# Patient Record
Sex: Female | Born: 1955
Health system: Southern US, Community
[De-identification: ages and names within clinical notes are randomized; demographics above are authoritative.]

## PROBLEM LIST (undated history)

## (undated) DIAGNOSIS — R7301 Impaired fasting glucose: Secondary | ICD-10-CM

## (undated) DIAGNOSIS — E785 Hyperlipidemia, unspecified: Secondary | ICD-10-CM

## (undated) DIAGNOSIS — E039 Hypothyroidism, unspecified: Secondary | ICD-10-CM

## (undated) DIAGNOSIS — I878 Other specified disorders of veins: Secondary | ICD-10-CM

## (undated) DIAGNOSIS — J45909 Unspecified asthma, uncomplicated: Secondary | ICD-10-CM

## (undated) DIAGNOSIS — J309 Allergic rhinitis, unspecified: Secondary | ICD-10-CM

## (undated) HISTORY — DX: Hypothyroidism, unspecified: E03.9

## (undated) HISTORY — PX: CHOLECYSTECTOMY: SHX55

## (undated) HISTORY — DX: Allergic rhinitis, unspecified: J30.9

## (undated) HISTORY — DX: Other specified disorders of veins: I87.8

## (undated) HISTORY — DX: Unspecified asthma, uncomplicated: J45.909

## (undated) HISTORY — DX: Impaired fasting glucose: R73.01

## (undated) HISTORY — DX: Hyperlipidemia, unspecified: E78.5

---

## 2009-09-19 ENCOUNTER — Encounter: Payer: Self-pay | Admitting: Orthopedic Surgery

## 2009-09-21 ENCOUNTER — Ambulatory Visit: Payer: Self-pay | Admitting: Orthopedic Surgery

## 2009-09-21 DIAGNOSIS — M25519 Pain in unspecified shoulder: Secondary | ICD-10-CM | POA: Insufficient documentation

## 2009-10-05 ENCOUNTER — Ambulatory Visit: Payer: Self-pay | Admitting: Orthopedic Surgery

## 2010-03-28 NOTE — Assessment & Plan Note (Signed)
Summary: 2 WK RE-CK RT UPPER PAIN/[employer] [facility]   Visit Type:  Follow-up Referring Ragena Fiola:  self Primary Damere Brandenburg:  Dr. Simone Curia  CC:  recheck arm.  History of Present Illness: I saw Ellen Ayers in the office today for an initial visit.  She is a 55 years old woman with the complaint of:  right arm pain.  The patient describes various symptoms in the RIGHT upper extremity over the last 2 months.  Things seemed to go wrong when she noticed pain in her RIGHT thumb at the base of the thumb at the basal joint.  She had pain with squeezing and pinching.  This pain worsened and radiated up towards her elbow but not quite to it.  She then started having some difficulties over the RIGHT deltoid and RIGHT upper arm over the biceps.  This was exacerbated when she tried to pull up her husband's TED hose.  This progressed to include trouble reaching behind her and pain in the deltoid and the biceps area when she actually lies on her LEFT side.  She does not really have pain with forward elevation but she does have some pain with weighted abduction forward elevation such as when something is in her hand.  Meds: Claritin, Ibuprofen.  Today is 2 week recheck on right arm, shoulder.  Somewhat better.  Allergies: No Known Drug Allergies  Physical Exam  Additional Exam:  repeat examination shows that she continues to have some mild discomfort in the RIGHT upper extremity but her range of motion is full her strength is excellent her shoulder is stable her neurologic exam is normal her skin is intact she has no lymphadenopathy Pulses, the biceps test including a speed's test and Yergason test are normal crank test was normal.   Impression & Recommendations:  Problem # 1:  SHOULDER PAIN (ICD-719.41) Assessment Improved  she says that her pain is going to go up to $70 so she think she is a better that she can not schedule a followup I gave her some shoulder exercises to do and if she  improves she does not have to come back  Orders: Est. Patient Level III (16109)  Patient Instructions: 1)  Biceps tests was fine 2)  You have some shoulder inflammation 3)  Please continue Ibuprofen and Exercises for 6 weeks at least three timer per week 4)  if improved will not come back

## 2010-03-28 NOTE — Letter (Signed)
Summary: History form  History form   Imported By: Jacklynn Ganong 09/22/2009 11:26:42  _____________________________________________________________________  External Attachment:    Type:   Image     Comment:   External Document

## 2010-03-28 NOTE — Assessment & Plan Note (Signed)
Summary: RT UPPER ARM PAIN/NEEDS XRAY/[employer] [facility]   Vital Signs:  Patient profile:   55 year old female Height:      62 inches Weight:      280 pounds Pulse rate:   78 / minute Resp:     18 per minute  Vitals Entered By: Fuller Canada MD (September 21, 2009 11:33 AM)  Visit Type:  new patient Referring Provider:  self Primary Provider:  Dr. Simone Curia  CC:  right arm pain.  History of Present Illness: I saw Ellen Ayers in the office today for an initial visit.  She is a 55 years old woman with the complaint of:  right arm pain.  The patient describes various symptoms in the RIGHT upper extremity over the last 2 months.  Things seemed to go wrong when she noticed pain in her RIGHT thumb at the base of the thumb at the basal joint.  She had pain with squeezing and pinching.  This pain worsened and radiated up towards her elbow but not quite to it.  She then started having some difficulties over the RIGHT deltoid and RIGHT upper arm over the biceps.  This was exacerbated when she tried to pull up her husband's TED hose.  This progressed to include trouble reaching behind her and pain in the deltoid and the biceps area when she actually lies on her LEFT side.  She does not really have pain with forward elevation but she does have some pain with weighted abduction forward elevation such as when something is in her hand.  Meds: Claritin.    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  Review of Systems Respiratory:  Complains of snoring; denies short of breath, wheezing, couch, tightness, pain on inspiration, and snoring  . Musculoskeletal:  Complains of muscle pain; denies joint pain, swelling, instability, stiffness, redness, and heat. Immunology:  Complains of seasonal allergies; denies sinus problems and allergic to bee stings.  The review of systems is negative for Constitutional, Cardiovascular, Gastrointestinal, Genitourinary, Neurologic, Endocrine, Psychiatric, Skin, HEENT, and Hemoatologic.  Physical Exam  Skin:  intact without lesions or rashes Cervical Nodes:  no significant adenopathy Psych:  alert and cooperative; normal mood and affect; normal attention span and concentration   Shoulder/Elbow Exam  General:    Well-developed, well-nourished, endomorphic body habitus; no deformities, normal grooming.    Inspection:    Inspection is normal.    Palpation:    the a.c. joint was not tender there was some mild tenderness at the anterolateral corner of the acromion and in the soft tissue, the posterior inferior subacromial soft tissues were nontender the biceps was nontender the coracoid was nontender  Vascular:    Radial, ulnar, brachial, and axillary pulses 2+ and symmetric; capillary refill less than 2 seconds; no evidence of ischemia, clubbing, or cyanosis.    Sensory:    Gross sensation intact in the upper extremities.    Motor:    Normal strength in the upper extremities.    Reflexes:    Normal reflexes in the upper extremities.    Shoulder Exam:    Right:    Inspection/Palpation:  passive range of motion.  Cuff strength grade  5, shoulder stable.    Left:    Inspection:  Normal    Palpation:  Normal  Impingement Sign NEER:    Right negative; Left negative Impingement Sign HAWKINS:    Right negative; Left negative Apprehension Sign:    Right negative; Left negative AC joint Adduction Test:    Right negative; Left negative   Impression & Recommendations:  Problem # 1:  SHOULDER PAIN (ICD-719.41) Assessment New  it is unclear right now what this is a differential  diagnosis is biceps tendinopathy, rotator cuff syndrome, rotator cuff tear, a.c. joint arthritis, trigger point, cervical disc disease.  Recommend ibuprofen t.i.d. with topical sports cream or rub and a 2 week recheck  Orders: New Patient Level III (16109)  Patient Instructions: 1)  Please schedule a follow-up appointment in 2 weeks.

## 2012-06-03 ENCOUNTER — Telehealth: Payer: Self-pay | Admitting: Family Medicine

## 2012-06-03 DIAGNOSIS — E785 Hyperlipidemia, unspecified: Secondary | ICD-10-CM

## 2012-06-03 DIAGNOSIS — Z79899 Other long term (current) drug therapy: Secondary | ICD-10-CM

## 2012-06-03 NOTE — Telephone Encounter (Signed)
Patient needs BW paper °

## 2012-06-03 NOTE — Telephone Encounter (Signed)
Blood work papers printed and left up front for patient pick up per doctors orders. Patient aware.

## 2012-06-03 NOTE — Telephone Encounter (Signed)
Lip liv glu 

## 2012-06-05 ENCOUNTER — Encounter: Payer: Self-pay | Admitting: *Deleted

## 2012-06-07 LAB — HEPATIC FUNCTION PANEL
Albumin: 3.7 g/dL (ref 3.5–5.2)
Total Protein: 6.6 g/dL (ref 6.0–8.3)

## 2012-06-07 LAB — LIPID PANEL
HDL: 45 mg/dL (ref >39)
LDL Cholesterol: 115 mg/dL — ABNORMAL HIGH (ref 0–99)
Total CHOL/HDL Ratio: 4.1 Ratio
Triglycerides: 129 mg/dL (ref <150)
VLDL: 26 mg/dL (ref 0–40)

## 2012-06-09 ENCOUNTER — Ambulatory Visit (INDEPENDENT_AMBULATORY_CARE_PROVIDER_SITE_OTHER): Payer: BC Managed Care – PPO | Admitting: Family Medicine

## 2012-06-09 ENCOUNTER — Encounter: Payer: Self-pay | Admitting: Family Medicine

## 2012-06-09 VITALS — BP 144/92 | HR 80 | Ht 61.0 in | Wt 288.0 lb

## 2012-06-09 DIAGNOSIS — E785 Hyperlipidemia, unspecified: Secondary | ICD-10-CM | POA: Insufficient documentation

## 2012-06-09 DIAGNOSIS — E039 Hypothyroidism, unspecified: Secondary | ICD-10-CM | POA: Insufficient documentation

## 2012-06-09 DIAGNOSIS — B353 Tinea pedis: Secondary | ICD-10-CM | POA: Insufficient documentation

## 2012-06-09 MED ORDER — PRAVASTATIN SODIUM 20 MG PO TABS: 20.0000 mg | ORAL_TABLET | Freq: Every day | ORAL | Status: DC

## 2012-06-09 MED ORDER — TERBINAFINE HCL 250 MG PO TABS: 250.0000 mg | ORAL_TABLET | Freq: Every day | ORAL | Status: DC

## 2012-06-09 MED ORDER — LEVOTHYROXINE SODIUM 88 MCG PO TABS: 88.0000 ug | ORAL_TABLET | Freq: Every day | ORAL | Status: DC

## 2012-06-09 NOTE — Progress Notes (Signed)
  Subjective:    Patient ID: Ellen Ayers, female    DOB: [birth year], 57 y.o.   MRN: [id number]  HPI Subjective:    Ellen Ayers is here for follow up of dyslipidemia.  Compliance with treatment has been good. The patient exercises never. Patient denies muscle pain associated with her medications.  The following portions of the patient's history were reviewed and updated as appropriate: allergies, current medications, past family history, past medical history, past social history, past surgical history and problem list.  Review of Systems Pertinent items are noted in HPI.    Objective:    General appearance: alert Lungs clear heart rrr feet tinea pedis changes bp good Lab Review Lab Results  Component Value Date   CHOL 186 06/03/2012   TRIG 129 06/03/2012   HDL 45 06/03/2012      Assessment:    Dyslipidemia under good control.    Plan:    1. Continue dietary measures. 2. Continue regular exercise. 3. Lipid-lowering medications: Pravastatin 10mg  QHS.    Review of Systems     Objective:   Physical Exam        Assessment & Plan:  Impression #1 hypothyroidism clinically stable. #2 tenia pedis discussed. #3 hyperlipidemia discussed. The numbers could at last visit. #4 perennial rhinitis-ongoing. Plan 25 minutes spent with patient most in discussion. Lamisil 250 daily for 30 days. Maintain loratadine. Maintain other meds.upper appropriate blood work. WSL

## 2012-06-10 ENCOUNTER — Ambulatory Visit: Payer: Self-pay | Admitting: Family Medicine

## 2012-10-09 ENCOUNTER — Encounter: Payer: Self-pay | Admitting: Nurse Practitioner

## 2012-10-09 ENCOUNTER — Ambulatory Visit (INDEPENDENT_AMBULATORY_CARE_PROVIDER_SITE_OTHER): Payer: BC Managed Care – PPO | Admitting: Nurse Practitioner

## 2012-10-09 VITALS — BP 110/86 | Temp 98.8°F | Ht 62.0 in | Wt 290.6 lb

## 2012-10-09 DIAGNOSIS — J069 Acute upper respiratory infection, unspecified: Secondary | ICD-10-CM

## 2012-10-09 MED ORDER — ALBUTEROL SULFATE HFA 108 (90 BASE) MCG/ACT IN AERS: 2.0000 | INHALATION_SPRAY | RESPIRATORY_TRACT | Status: DC | PRN

## 2012-10-09 MED ORDER — AMOXICILLIN-POT CLAVULANATE 875-125 MG PO TABS: 1.0000 | ORAL_TABLET | Freq: Two times a day (BID) | ORAL | Status: DC

## 2012-10-09 MED ORDER — TRIAMTERENE-HCTZ 37.5-25 MG PO CAPS: 1.0000 | ORAL_CAPSULE | ORAL | Status: DC

## 2012-10-10 ENCOUNTER — Encounter: Payer: Self-pay | Admitting: Nurse Practitioner

## 2012-10-10 NOTE — Progress Notes (Signed)
Subjective:  Presents with complaints of a bad cough over the past 4-5 days. No fever. Facial area headache. No wheezing. Some hoarseness but no sore throat. Some ear pain. No vomiting diarrhea or abdominal pain. Slight shortness of breath with singing talking or activity. Still has some of her Ventolin inhaler, this helps her shortness of breath. At end of visit patient requesting a refill on her Dyazide which she uses sparingly for edema.  Objective:   BP 110/86  Temp(Src) 98.8 F (37.1 C) (Oral)  Ht 5\' 2"  (1.575 m)  Wt 290 lb 9.6 oz (131.815 kg)  BMI 53.14 kg/m2 NAD. Alert, oriented. TMs significant clear effusion, no erythema. Pharynx mildly injected with PND noted. Neck supple with mild soft nontender adenopathy. Occasional harsh bronchitic cough noted. Lungs clear. Heart regular rate rhythm.  Assessment:Acute upper respiratory infections of unspecified site  Plan: Meds ordered this encounter  Medications  . amoxicillin-clavulanate (AUGMENTIN) 875-125 MG per tablet    Sig: Take 1 tablet by mouth 2 (two) times daily.    Dispense:  20 tablet    Refill:  0    Order Specific Question:  Supervising Provider    Answer:  Merlyn Albert [[id number]]  . triamterene-hydrochlorothiazide (DYAZIDE) 37.5-25 MG per capsule    Sig: Take 1 each (1 capsule total) by mouth every morning.    Dispense:  30 capsule    Refill:  5    Order Specific Question:  Supervising Provider    Answer:  Merlyn Albert [[id number]]  . albuterol (PROVENTIL HFA;VENTOLIN HFA) 108 (90 BASE) MCG/ACT inhaler    Sig: Inhale 2 puffs into the lungs every 4 (four) hours as needed for wheezing.    Dispense:  1 Inhaler    Refill:  2    HOLD UNTIL PATIENT NEEDS THIS    Order Specific Question:  Supervising Provider    Answer:  Merlyn Albert [[id number]]   OTC meds as directed for congestion. Call back in 4-5 days if no improvement, sooner if worse. Otherwise routine followup.

## 2012-10-16 ENCOUNTER — Telehealth: Payer: Self-pay | Admitting: Family Medicine

## 2012-10-16 MED ORDER — CEFPROZIL 500 MG PO TABS: 500.0000 mg | ORAL_TABLET | Freq: Two times a day (BID) | ORAL | Status: DC

## 2012-10-16 NOTE — Telephone Encounter (Signed)
Med sent electronically to [facility].

## 2012-10-16 NOTE — Telephone Encounter (Signed)
Patient says that she does not think that the antibiotic she was prescribed by Eber Jones the other day is working  She is still having persistent coughing, SOB after exertion-no congestion, fever or headache [facility]

## 2012-10-16 NOTE — Telephone Encounter (Signed)
Cefzil 500 bid for 10 days

## 2012-10-16 NOTE — Telephone Encounter (Signed)
Please call her at [phone] to reach her

## 2013-01-16 ENCOUNTER — Telehealth: Payer: Self-pay | Admitting: Family Medicine

## 2013-01-16 DIAGNOSIS — E039 Hypothyroidism, unspecified: Secondary | ICD-10-CM

## 2013-01-16 DIAGNOSIS — E785 Hyperlipidemia, unspecified: Secondary | ICD-10-CM

## 2013-01-16 DIAGNOSIS — Z79899 Other long term (current) drug therapy: Secondary | ICD-10-CM

## 2013-01-16 MED ORDER — LEVOTHYROXINE SODIUM 88 MCG PO TABS: 88.0000 ug | ORAL_TABLET | Freq: Every day | ORAL | Status: DC

## 2013-01-16 NOTE — Telephone Encounter (Signed)
Patient needs a prescription for levothyroxine (SYNTHROID, LEVOTHROID) 88 MCG tablet sent to [facility] in [place].  Patient will out of this medication on Saturday 01/17/2013.  [facility] informed patient they sent our office communication regarding this medication on 01/14/2013.

## 2013-01-16 NOTE — Telephone Encounter (Signed)
Blood work ordered in Epic. Patient was notified.  

## 2013-01-16 NOTE — Telephone Encounter (Signed)
Refill on medication sent to [facility] in Epic. Patient is due for office visit. Patient wants blood work ordered first and then she will schedule office visit after this.

## 2013-01-16 NOTE — Telephone Encounter (Signed)
Lip liv m7 tsh 

## 2013-01-16 NOTE — Addendum Note (Signed)
Addended by: Dereck Ligas on: 01/16/2013 03:45 PM   Modules accepted: Orders

## 2013-02-06 ENCOUNTER — Telehealth: Payer: Self-pay | Admitting: Family Medicine

## 2013-02-06 MED ORDER — ALBUTEROL SULFATE HFA 108 (90 BASE) MCG/ACT IN AERS: 2.0000 | INHALATION_SPRAY | RESPIRATORY_TRACT | Status: DC | PRN

## 2013-02-06 NOTE — Telephone Encounter (Signed)
Patient notified

## 2013-02-06 NOTE — Telephone Encounter (Signed)
Pt lost her Albuterol inhaler, can we call in another one?  [facility] Please call pt when don

## 2013-02-13 ENCOUNTER — Other Ambulatory Visit: Payer: Self-pay | Admitting: Family Medicine

## 2013-02-18 LAB — BASIC METABOLIC PANEL
BUN: 11 mg/dL (ref 6–23)
Creat: 0.79 mg/dL (ref 0.50–1.10)

## 2013-02-18 LAB — HEPATIC FUNCTION PANEL
ALT: 17 U/L (ref 0–35)
Alkaline Phosphatase: 85 U/L (ref 39–117)
Indirect Bilirubin: 0.9 mg/dL (ref 0.0–0.9)
Total Protein: 6.4 g/dL (ref 6.0–8.3)

## 2013-02-18 LAB — LIPID PANEL
Cholesterol: 193 mg/dL (ref 0–200)
Triglycerides: 186 mg/dL — ABNORMAL HIGH (ref <150)

## 2013-02-18 LAB — TSH: TSH: 3.583 u[IU]/mL (ref 0.350–4.500)

## 2013-03-03 ENCOUNTER — Ambulatory Visit (INDEPENDENT_AMBULATORY_CARE_PROVIDER_SITE_OTHER): Payer: BC Managed Care – PPO | Admitting: Family Medicine

## 2013-03-03 ENCOUNTER — Encounter: Payer: Self-pay | Admitting: Family Medicine

## 2013-03-03 VITALS — BP 130/82 | Ht 62.0 in | Wt 290.0 lb

## 2013-03-03 DIAGNOSIS — E785 Hyperlipidemia, unspecified: Secondary | ICD-10-CM

## 2013-03-03 DIAGNOSIS — E039 Hypothyroidism, unspecified: Secondary | ICD-10-CM

## 2013-03-03 DIAGNOSIS — R7301 Impaired fasting glucose: Secondary | ICD-10-CM

## 2013-03-03 MED ORDER — AMOXICILLIN-POT CLAVULANATE 875-125 MG PO TABS: 1.0000 | ORAL_TABLET | Freq: Two times a day (BID) | ORAL | Status: DC

## 2013-03-03 MED ORDER — TRIAMTERENE-HCTZ 37.5-25 MG PO CAPS: 1.0000 | ORAL_CAPSULE | ORAL | Status: DC

## 2013-03-03 MED ORDER — LEVOTHYROXINE SODIUM 88 MCG PO TABS: 88.0000 ug | ORAL_TABLET | Freq: Every day | ORAL | Status: DC

## 2013-03-03 MED ORDER — PRAVASTATIN SODIUM 20 MG PO TABS: ORAL_TABLET | ORAL | Status: DC

## 2013-03-03 MED ORDER — TRIAMCINOLONE ACETONIDE 0.1 % EX CREA: 1.0000 "application " | TOPICAL_CREAM | Freq: Two times a day (BID) | CUTANEOUS | Status: DC

## 2013-03-03 NOTE — Progress Notes (Signed)
   Subjective:    Patient ID: Ellen Ayers, female    DOB: 02-Feb-1956, 58 y.o.   MRN: [id number]  HPI  Patient arrives for a follow up on blood pressure and thyroid.   Persistent cough and draingae, cannot get rid of it  Using inhaler faithfully  Using mucinex d prn  Patient also reports cough for 5 weeks, itching in palms and numbness in wrist.  Sticking with thyroid med, compliant on it, Takes the med religiously and in  Reports itching in the hands. Some problem with dry palms.  Recently just had blood work.   Review of Systems No headache no chest pain no back pain no abdominal pain no change in bowel habits no blood in stool ROS otherwise negative    Objective:   Physical Exam Alert hydration good. HET moderate his congestion frontal tenderness neck supple. Lungs clear. Heart regular in rhythm. He has no true rash pulses intact sensation intact. Ankles without edema.       Assessment & Plan:  Impression 1 hyperlipidemia labs reviewed control good. #2 hypothyroidism controlled good. Labs reviewed. #3 rhinosinusitis discuss. #4 eczema hands discussed. #5 rhinosinusitis #6 impaired fasting glucose plan antibiotics prescribed. Triamcinolone cream twice a day to affected area. Maintain other medications. Diet exercise discussed. Strongly encouraged to cut down sugars immediately and lose weight if at all possible. Very close to diagnosis of diabetes patient understands this. WSL

## 2013-04-18 ENCOUNTER — Other Ambulatory Visit: Payer: Self-pay | Admitting: Family Medicine

## 2013-09-15 ENCOUNTER — Telehealth: Payer: Self-pay | Admitting: Family Medicine

## 2013-09-15 DIAGNOSIS — E785 Hyperlipidemia, unspecified: Secondary | ICD-10-CM

## 2013-09-15 DIAGNOSIS — E039 Hypothyroidism, unspecified: Secondary | ICD-10-CM

## 2013-09-15 DIAGNOSIS — Z131 Encounter for screening for diabetes mellitus: Secondary | ICD-10-CM

## 2013-09-15 DIAGNOSIS — Z79899 Other long term (current) drug therapy: Secondary | ICD-10-CM

## 2013-09-15 NOTE — Telephone Encounter (Signed)
Lip liv A1c 

## 2013-09-15 NOTE — Telephone Encounter (Signed)
bw orders for appt on 7/7  Call when sent

## 2013-09-16 NOTE — Telephone Encounter (Signed)
Patient notified and verbalized understanding. 

## 2013-09-25 LAB — HEPATIC FUNCTION PANEL
ALBUMIN: 3.7 g/dL (ref 3.5–5.2)
ALK PHOS: 97 U/L (ref 39–117)
ALT: 16 U/L (ref 0–35)
AST: 16 U/L (ref 0–37)
BILIRUBIN TOTAL: 1.2 mg/dL (ref 0.2–1.2)
Bilirubin, Direct: 0.2 mg/dL (ref 0.0–0.3)
Indirect Bilirubin: 1 mg/dL (ref 0.2–1.2)
Total Protein: 6.6 g/dL (ref 6.0–8.3)

## 2013-09-25 LAB — LIPID PANEL
CHOL/HDL RATIO: 4.5 ratio
CHOLESTEROL: 193 mg/dL (ref 0–200)
HDL: 43 mg/dL (ref >39)
LDL Cholesterol: 122 mg/dL — ABNORMAL HIGH (ref 0–99)
Triglycerides: 141 mg/dL (ref <150)
VLDL: 28 mg/dL (ref 0–40)

## 2013-09-25 LAB — HEMOGLOBIN A1C
HEMOGLOBIN A1C: 6.4 % — AB (ref <5.7)
Mean Plasma Glucose: 137 mg/dL — ABNORMAL HIGH (ref <117)

## 2013-09-26 LAB — TSH: TSH: 7.795 u[IU]/mL — AB (ref 0.350–4.500)

## 2013-10-02 ENCOUNTER — Ambulatory Visit (INDEPENDENT_AMBULATORY_CARE_PROVIDER_SITE_OTHER): Payer: BC Managed Care – PPO | Admitting: Nurse Practitioner

## 2013-10-02 ENCOUNTER — Encounter: Payer: Self-pay | Admitting: Nurse Practitioner

## 2013-10-02 VITALS — BP 138/92 | Ht 62.0 in | Wt 292.0 lb

## 2013-10-02 DIAGNOSIS — E785 Hyperlipidemia, unspecified: Secondary | ICD-10-CM

## 2013-10-02 DIAGNOSIS — IMO0001 Reserved for inherently not codable concepts without codable children: Secondary | ICD-10-CM

## 2013-10-02 DIAGNOSIS — Z9114 Patient's other noncompliance with medication regimen: Secondary | ICD-10-CM

## 2013-10-02 DIAGNOSIS — Z9119 Patient's noncompliance with other medical treatment and regimen: Secondary | ICD-10-CM

## 2013-10-02 DIAGNOSIS — E039 Hypothyroidism, unspecified: Secondary | ICD-10-CM

## 2013-10-02 DIAGNOSIS — Z91148 Patient's other noncompliance with medication regimen for other reason: Secondary | ICD-10-CM

## 2013-10-02 DIAGNOSIS — Z91199 Patient's noncompliance with other medical treatment and regimen due to unspecified reason: Secondary | ICD-10-CM

## 2013-10-02 DIAGNOSIS — R600 Localized edema: Secondary | ICD-10-CM

## 2013-10-02 DIAGNOSIS — R7301 Impaired fasting glucose: Secondary | ICD-10-CM

## 2013-10-02 DIAGNOSIS — R030 Elevated blood-pressure reading, without diagnosis of hypertension: Secondary | ICD-10-CM

## 2013-10-02 DIAGNOSIS — R609 Edema, unspecified: Secondary | ICD-10-CM

## 2013-10-02 MED ORDER — PRAVASTATIN SODIUM 20 MG PO TABS: ORAL_TABLET | ORAL | Status: DC

## 2013-10-02 MED ORDER — TRIAMTERENE-HCTZ 37.5-25 MG PO TABS: 1.0000 | ORAL_TABLET | Freq: Every day | ORAL | Status: DC

## 2013-10-02 MED ORDER — LEVOTHYROXINE SODIUM 88 MCG PO TABS: ORAL_TABLET | ORAL | Status: DC

## 2013-10-02 NOTE — Patient Instructions (Signed)

## 2013-10-06 ENCOUNTER — Encounter: Payer: Self-pay | Admitting: Nurse Practitioner

## 2013-10-06 DIAGNOSIS — I10 Essential (primary) hypertension: Secondary | ICD-10-CM | POA: Insufficient documentation

## 2013-10-06 DIAGNOSIS — R030 Elevated blood-pressure reading, without diagnosis of hypertension: Secondary | ICD-10-CM

## 2013-10-06 NOTE — Progress Notes (Signed)
Subjective:  Presents for routine followup and review recent lab work. Needs refills on her medications. Has missed some doses. No chest pain shortness of breath. Some edema. Is out of her Dyazide, did not take this every day. No orthopnea. No sore throat or difficulty swallowing.  Objective:   BP 138/92  Ht 5\' 2"  (1.575 m)  Wt 292 lb (132.45 kg)  BMI 53.39 kg/m2 NAD. Alert, oriented. Lungs clear. Heart regular rate rhythm. Lower extremities trace pitting edema. Thyroid normal limit to palpation and nontender. No masses or goiters noted. See lab work dated 7/31. TSH elevated at 7.7. Significant central obesity.   Assessment:  Problem List Items Addressed This Visit     Endocrine   Unspecified hypothyroidism   Relevant Medications      levothyroxine (SYNTHROID, LEVOTHROID) tablet   Other Relevant Orders      TSH   Impaired fasting glucose - Primary     Other   Other and unspecified hyperlipidemia   Relevant Medications      pravastatin (PRAVACHOL) tablet      triamterene-hydrochlorothiazide (MAXZIDE-25) 37.5-25 MG per tablet    Other Visit Diagnoses   Peripheral edema        Elevated blood pressure        Noncompliance with medications             Plan: Meds ordered this encounter  Medications  . levothyroxine (SYNTHROID, LEVOTHROID) 88 MCG tablet    Sig: TAKE ONE TABLET BY MOUTH ONCE DAILY BEFORE BREAKFAST    Dispense:  30 tablet    Refill:  5    Order Specific Question:  Supervising Provider    Answer:  Merlyn AlbertLUKING, WILLIAM S [[id number]]  . pravastatin (PRAVACHOL) 20 MG tablet    Sig: TAKE ONE TABLET BY MOUTH ONCE DAILY    Dispense:  30 tablet    Refill:  5    Order Specific Question:  Supervising Provider    Answer:  Merlyn AlbertLUKING, WILLIAM S [[id number]]  . triamterene-hydrochlorothiazide (MAXZIDE-25) 37.5-25 MG per tablet    Sig: Take 1 tablet by mouth daily.    Dispense:  30 tablet    Refill:  5   Restart medications on a daily basis. Repeat TSH in 2 months. Recommend patient  take Dyazide daily for BP and fluid. Discussed importance of healthy diet exercise and weight loss. Return in about 3 months (around 01/02/2014).

## 2013-11-28 LAB — TSH: TSH: 1.984 u[IU]/mL (ref 0.350–4.500)

## 2013-11-29 ENCOUNTER — Encounter: Payer: Self-pay | Admitting: Nurse Practitioner

## 2013-12-02 ENCOUNTER — Ambulatory Visit (INDEPENDENT_AMBULATORY_CARE_PROVIDER_SITE_OTHER): Payer: BC Managed Care – PPO | Admitting: Nurse Practitioner

## 2013-12-02 ENCOUNTER — Encounter: Payer: Self-pay | Admitting: Nurse Practitioner

## 2013-12-02 VITALS — BP 110/76 | Ht 59.75 in | Wt 297.0 lb

## 2013-12-02 DIAGNOSIS — R7301 Impaired fasting glucose: Secondary | ICD-10-CM

## 2013-12-02 DIAGNOSIS — E039 Hypothyroidism, unspecified: Secondary | ICD-10-CM

## 2013-12-02 MED ORDER — PHENTERMINE HCL 37.5 MG PO TABS: 37.5000 mg | ORAL_TABLET | Freq: Every day | ORAL | Status: DC

## 2013-12-06 ENCOUNTER — Encounter: Payer: Self-pay | Admitting: Nurse Practitioner

## 2013-12-06 NOTE — Progress Notes (Signed)
Subjective:  Presents for routine followup. Does not take her Dyazide daily, takes as needed. If she takes it daily her blood pressure drops too low. Has been compliant with her thyroid medication. Would like to start weight loss medication.  Objective:   BP 110/76  Ht 4' 11.75" (1.518 m)  Wt 297 lb (134.718 kg)  BMI 58.46 kg/m2 NAD. Alert, oriented. Lungs clear. Heart regular rate rhythm. Significant central obesity with large waist circumference noted. TSH on 11/28/13 was normal at 1.9.  Assessment:  Problem List Items Addressed This Visit     Endocrine   Hypothyroidism   Impaired fasting glucose - Primary     Other   Morbid obesity   Relevant Medications      phentermine (ADIPEX-P) 37.5 MG tablet       Plan:  Meds ordered this encounter  Medications  . phentermine (ADIPEX-P) 37.5 MG tablet    Sig: Take 1 tablet (37.5 mg total) by mouth daily before breakfast.    Dispense:  30 tablet    Refill:  2    Order Specific Question:  Supervising Provider    Answer:  Merlyn AlbertLUKING, WILLIAM S [[id number]]   continue medications as directed. Use Dyazide only as needed for edema or elevated blood pressure. Reviewed potential adverse effects associated with phentermine, DC med and call if any problems. Encouraged healthy diet and regular activity. Return in about 3 months (around 03/04/2014).

## 2014-03-03 ENCOUNTER — Other Ambulatory Visit: Payer: Self-pay | Admitting: *Deleted

## 2014-03-03 MED ORDER — LEVOTHYROXINE SODIUM 88 MCG PO TABS: ORAL_TABLET | ORAL | Status: DC

## 2014-03-03 MED ORDER — PRAVASTATIN SODIUM 20 MG PO TABS: ORAL_TABLET | ORAL | Status: DC

## 2014-03-03 MED ORDER — TRIAMTERENE-HCTZ 37.5-25 MG PO TABS: 1.0000 | ORAL_TABLET | Freq: Every day | ORAL | Status: DC

## 2014-03-04 ENCOUNTER — Ambulatory Visit: Payer: BC Managed Care – PPO | Admitting: Nurse Practitioner

## 2014-04-26 ENCOUNTER — Other Ambulatory Visit: Payer: Self-pay | Admitting: Family Medicine

## 2014-06-28 ENCOUNTER — Other Ambulatory Visit: Payer: Self-pay | Admitting: Nurse Practitioner

## 2014-06-28 ENCOUNTER — Telehealth: Payer: Self-pay | Admitting: Nurse Practitioner

## 2014-06-28 DIAGNOSIS — E785 Hyperlipidemia, unspecified: Secondary | ICD-10-CM

## 2014-06-28 DIAGNOSIS — R5383 Other fatigue: Secondary | ICD-10-CM

## 2014-06-28 DIAGNOSIS — R7301 Impaired fasting glucose: Secondary | ICD-10-CM

## 2014-06-28 DIAGNOSIS — E039 Hypothyroidism, unspecified: Secondary | ICD-10-CM

## 2014-06-28 DIAGNOSIS — Z79899 Other long term (current) drug therapy: Secondary | ICD-10-CM

## 2014-06-28 NOTE — Telephone Encounter (Signed)
Pt needs bw or appt next Friday  Aware of [facility] call when sent so she can go   Last labs 09/25/13 TSH, A1C, Lip, Hep fun  11/28/13 TSH

## 2014-06-28 NOTE — Telephone Encounter (Signed)
Labs ordered.

## 2014-06-28 NOTE — Telephone Encounter (Signed)
Left message on voicemail notifying patient bloodwork has been ordered.  

## 2014-07-05 LAB — BASIC METABOLIC PANEL
BUN/Creatinine Ratio: 19 (ref 11–26)
BUN: 14 mg/dL (ref 8–27)
CHLORIDE: 103 mmol/L (ref 97–108)
CO2: 23 mmol/L (ref 18–29)
Calcium: 8.7 mg/dL (ref 8.7–10.3)
Creatinine, Ser: 0.75 mg/dL (ref 0.57–1.00)
GFR calc Af Amer: 99 mL/min/{1.73_m2} (ref >59)
GFR calc non Af Amer: 86 mL/min/{1.73_m2} (ref >59)
GLUCOSE: 140 mg/dL — AB (ref 65–99)
Potassium: 4.7 mmol/L (ref 3.5–5.2)
Sodium: 142 mmol/L (ref 134–144)

## 2014-07-05 LAB — LIPID PANEL
Chol/HDL Ratio: 4 ratio units (ref 0.0–4.4)
Cholesterol, Total: 190 mg/dL (ref 100–199)
HDL: 48 mg/dL (ref >39)
LDL Calculated: 119 mg/dL — ABNORMAL HIGH (ref 0–99)
TRIGLYCERIDES: 117 mg/dL (ref 0–149)
VLDL Cholesterol Cal: 23 mg/dL (ref 5–40)

## 2014-07-05 LAB — HEPATIC FUNCTION PANEL
ALK PHOS: 97 IU/L (ref 39–117)
ALT: 20 IU/L (ref 0–32)
AST: 21 IU/L (ref 0–40)
Albumin: 3.8 g/dL (ref 3.6–4.8)
BILIRUBIN, DIRECT: 0.14 mg/dL (ref 0.00–0.40)
Bilirubin Total: 0.6 mg/dL (ref 0.0–1.2)
Total Protein: 6.5 g/dL (ref 6.0–8.5)

## 2014-07-05 LAB — HEMOGLOBIN A1C
ESTIMATED AVERAGE GLUCOSE: 146 mg/dL
HEMOGLOBIN A1C: 6.7 % — AB (ref 4.8–5.6)

## 2014-07-05 LAB — VITAMIN D 25 HYDROXY (VIT D DEFICIENCY, FRACTURES): Vit D, 25-Hydroxy: 10.9 ng/mL — ABNORMAL LOW (ref 30.0–100.0)

## 2014-07-05 LAB — TSH: TSH: 2.44 u[IU]/mL (ref 0.450–4.500)

## 2014-07-06 ENCOUNTER — Encounter: Payer: Self-pay | Admitting: Nurse Practitioner

## 2014-07-06 DIAGNOSIS — E119 Type 2 diabetes mellitus without complications: Secondary | ICD-10-CM | POA: Insufficient documentation

## 2014-07-09 ENCOUNTER — Encounter: Payer: Self-pay | Admitting: Nurse Practitioner

## 2014-07-09 ENCOUNTER — Ambulatory Visit (INDEPENDENT_AMBULATORY_CARE_PROVIDER_SITE_OTHER): Payer: BC Managed Care – PPO | Admitting: Nurse Practitioner

## 2014-07-09 VITALS — BP 130/86 | Ht 59.75 in | Wt 295.4 lb

## 2014-07-09 DIAGNOSIS — E785 Hyperlipidemia, unspecified: Secondary | ICD-10-CM

## 2014-07-09 DIAGNOSIS — E559 Vitamin D deficiency, unspecified: Secondary | ICD-10-CM

## 2014-07-09 DIAGNOSIS — F419 Anxiety disorder, unspecified: Secondary | ICD-10-CM

## 2014-07-09 DIAGNOSIS — F411 Generalized anxiety disorder: Secondary | ICD-10-CM

## 2014-07-09 DIAGNOSIS — F430 Acute stress reaction: Secondary | ICD-10-CM

## 2014-07-09 DIAGNOSIS — E119 Type 2 diabetes mellitus without complications: Secondary | ICD-10-CM

## 2014-07-09 MED ORDER — PRAVASTATIN SODIUM 20 MG PO TABS: 20.0000 mg | ORAL_TABLET | Freq: Every day | ORAL | Status: DC

## 2014-07-09 MED ORDER — ALBUTEROL SULFATE HFA 108 (90 BASE) MCG/ACT IN AERS: 2.0000 | INHALATION_SPRAY | RESPIRATORY_TRACT | Status: DC | PRN

## 2014-07-09 MED ORDER — LEVOTHYROXINE SODIUM 88 MCG PO TABS: ORAL_TABLET | ORAL | Status: DC

## 2014-07-09 MED ORDER — VITAMIN D (ERGOCALCIFEROL) 1.25 MG (50000 UNIT) PO CAPS: 50000.0000 [IU] | ORAL_CAPSULE | ORAL | Status: DC

## 2014-07-09 MED ORDER — CITALOPRAM HYDROBROMIDE 20 MG PO TABS: ORAL_TABLET | ORAL | Status: DC

## 2014-07-09 NOTE — Patient Instructions (Signed)
US P

## 2014-07-11 ENCOUNTER — Encounter: Payer: Self-pay | Admitting: Nurse Practitioner

## 2014-07-11 NOTE — Progress Notes (Signed)
Subjective:  Presents for routine follow up and to discuss recent labs. Trying to do better with diet. Under exceptional personal stress. Some stress eating. No regular exercise.   Objective:   BP 130/86 mmHg  Ht 4' 11.75" (1.518 m)  Wt 295 lb 6.4 oz (133.993 kg)  BMI 58.15 kg/m2 NAD. Alert, oriented. Lungs clear. Heart RRR.  Results for orders placed or performed in visit on 06/28/14  Basic metabolic panel  Result Value Ref Range   Glucose 140 (H) 65 - 99 mg/dL   BUN 14 8 - 27 mg/dL   Creatinine, Ser 1.610.75 0.57 - 1.00 mg/dL   GFR calc non Af Amer 86 >59 mL/min/1.73   GFR calc Af Amer 99 >59 mL/min/1.73   BUN/Creatinine Ratio 19 11 - 26   Sodium 142 134 - 144 mmol/L   Potassium 4.7 3.5 - 5.2 mmol/L   Chloride 103 97 - 108 mmol/L   CO2 23 18 - 29 mmol/L   Calcium 8.7 8.7 - 10.3 mg/dL  Hepatic function panel  Result Value Ref Range   Total Protein 6.5 6.0 - 8.5 g/dL   Albumin 3.8 3.6 - 4.8 g/dL   Bilirubin Total 0.6 0.0 - 1.2 mg/dL   Bilirubin, Direct 0.960.14 0.00 - 0.40 mg/dL   Alkaline Phosphatase 97 39 - 117 IU/L   AST 21 0 - 40 IU/L   ALT 20 0 - 32 IU/L  Hemoglobin A1c  Result Value Ref Range   Hgb A1c MFr Bld 6.7 (H) 4.8 - 5.6 %   Est. average glucose Bld gHb Est-mCnc 146 mg/dL  Lipid panel  Result Value Ref Range   Cholesterol, Total 190 100 - 199 mg/dL   Triglycerides 045117 0 - 149 mg/dL   HDL 48 >40>39 mg/dL   VLDL Cholesterol Cal 23 5 - 40 mg/dL   LDL Calculated 981119 (H) 0 - 99 mg/dL   Chol/HDL Ratio 4.0 0.0 - 4.4 ratio units  TSH  Result Value Ref Range   TSH 2.440 0.450 - 4.500 uIU/mL  Vit D  25 hydroxy (rtn osteoporosis monitoring)  Result Value Ref Range   Vit D, 25-Hydroxy 10.9 (L) 30.0 - 100.0 ng/mL     Assessment:  Problem List Items Addressed This Visit      Endocrine   Diabetes mellitus type 2, uncomplicated - Primary   Relevant Medications   pravastatin (PRAVACHOL) 20 MG tablet     Other   Anxiety as acute reaction to exceptional stress   Relevant  Medications   citalopram (CELEXA) 20 MG tablet   Hyperlipidemia   Relevant Medications   pravastatin (PRAVACHOL) 20 MG tablet   Vitamin D deficiency     Plan: Meds ordered this encounter  Medications  . Vitamin D, Ergocalciferol, (DRISDOL) 50000 UNITS CAPS capsule    Sig: Take 1 capsule (50,000 Units total) by mouth every 7 (seven) days.    Dispense:  4 capsule    Refill:  1    Order Specific Question:  Supervising Provider    Answer:  Merlyn AlbertLUKING, WILLIAM S [[id number]]  . citalopram (CELEXA) 20 MG tablet    Sig: 1/2 tab po qhs x 6 d then one po qhs    Dispense:  30 tablet    Refill:  0    Order Specific Question:  Supervising Provider    Answer:  Merlyn AlbertLUKING, WILLIAM S [[id number]]  . albuterol (PROVENTIL HFA;VENTOLIN HFA) 108 (90 BASE) MCG/ACT inhaler    Sig: Inhale 2 puffs into the lungs every  4 (four) hours as needed for wheezing.    Dispense:  1 Inhaler    Refill:  2    HOLD UNTIL PATIENT NEEDS THIS    Order Specific Question:  Supervising Provider    Answer:  Merlyn AlbertLUKING, WILLIAM S [[id number]]  . levothyroxine (SYNTHROID, LEVOTHROID) 88 MCG tablet    Sig: Take 1 tablet by mouth once daily before breakfast    Dispense:  90 tablet    Refill:  3    Order Specific Question:  Supervising Provider    Answer:  Merlyn AlbertLUKING, WILLIAM S [[id number]]  . pravastatin (PRAVACHOL) 20 MG tablet    Sig: Take 1 tablet (20 mg total) by mouth daily.    Dispense:  90 tablet    Refill:  1    Order Specific Question:  Supervising Provider    Answer:  Merlyn AlbertLUKING, WILLIAM S [[id number]]   Discussed regular activity and weight loss goal of 30 lbs. Start Celexa as directed to help with anxiety and stress. DC med and call if any problems.  Return in about 1 month (around 08/09/2014) for recheck.

## 2014-07-15 ENCOUNTER — Encounter: Payer: Self-pay | Admitting: Nurse Practitioner

## 2014-08-09 ENCOUNTER — Encounter: Payer: Self-pay | Admitting: Nurse Practitioner

## 2014-08-09 ENCOUNTER — Ambulatory Visit: Payer: BC Managed Care – PPO | Admitting: Nurse Practitioner

## 2014-08-09 ENCOUNTER — Ambulatory Visit (INDEPENDENT_AMBULATORY_CARE_PROVIDER_SITE_OTHER): Payer: BC Managed Care – PPO | Admitting: Nurse Practitioner

## 2014-08-09 VITALS — BP 136/84 | Ht 59.75 in | Wt 286.1 lb

## 2014-08-09 DIAGNOSIS — F411 Generalized anxiety disorder: Secondary | ICD-10-CM

## 2014-08-09 DIAGNOSIS — F430 Acute stress reaction: Principal | ICD-10-CM

## 2014-08-09 DIAGNOSIS — F419 Anxiety disorder, unspecified: Secondary | ICD-10-CM

## 2014-08-09 MED ORDER — CITALOPRAM HYDROBROMIDE 20 MG PO TABS: ORAL_TABLET | ORAL | Status: DC

## 2014-08-10 ENCOUNTER — Ambulatory Visit: Payer: BC Managed Care – PPO | Admitting: Nurse Practitioner

## 2014-08-10 ENCOUNTER — Encounter: Payer: Self-pay | Admitting: Nurse Practitioner

## 2014-08-10 NOTE — Progress Notes (Signed)
Subjective:  Presents for recheck. Doing well on Celexa. Has had tremendous stress over the past 2 weeks due to family issues and has handled it much better than previously. Sleeping well at nighttime. Has been working on her weight loss.  Objective:   BP 136/84 mmHg  Ht 4' 11.75" (1.518 m)  Wt 286 lb 2 oz (129.785 kg)  BMI 56.32 kg/m2 NAD. Alert, oriented. Much calmer affect. Lungs clear. Heart regular rate rhythm.  Assessment:  Problem List Items Addressed This Visit      Other   Anxiety as acute reaction to exceptional stress - Primary   Relevant Medications   citalopram (CELEXA) 20 MG tablet     Plan:  Meds ordered this encounter  Medications  . citalopram (CELEXA) 20 MG tablet    Sig: one po qhs    Dispense:  90 tablet    Refill:  3    Order Specific Question:  Supervising Provider    Answer:  Merlyn Albert [[id number]]   Continue Celexa as directed. Reminded patient about preventive health physical. Return in about 6 months (around 02/08/2015) for recheck.

## 2014-08-13 ENCOUNTER — Ambulatory Visit: Payer: BC Managed Care – PPO | Admitting: Nurse Practitioner

## 2015-03-21 ENCOUNTER — Telehealth: Payer: Self-pay | Admitting: Family Medicine

## 2015-03-21 DIAGNOSIS — E785 Hyperlipidemia, unspecified: Secondary | ICD-10-CM

## 2015-03-21 DIAGNOSIS — Z79899 Other long term (current) drug therapy: Secondary | ICD-10-CM

## 2015-03-21 DIAGNOSIS — E119 Type 2 diabetes mellitus without complications: Secondary | ICD-10-CM

## 2015-03-21 NOTE — Telephone Encounter (Signed)
Pt called wanting to know if she needs to have labs done for her upcoming appt. Last labs per epic were: bmp,hepatic,a1c,lipid,tsh,and vit d on 07/03/14

## 2015-03-21 NOTE — Telephone Encounter (Signed)
Lip liv A1c 

## 2015-03-22 NOTE — Telephone Encounter (Signed)
Orders put in. Pt notified on vm  

## 2015-03-22 NOTE — Addendum Note (Signed)
Addended by: Metro Kung on: 03/22/2015 08:50 AM   Modules accepted: Orders

## 2015-03-22 NOTE — Addendum Note (Signed)
Addended by: Metro Kung on: 03/22/2015 08:24 AM   Modules accepted: Orders

## 2015-04-04 ENCOUNTER — Ambulatory Visit (INDEPENDENT_AMBULATORY_CARE_PROVIDER_SITE_OTHER): Payer: BC Managed Care – PPO | Admitting: Nurse Practitioner

## 2015-04-04 ENCOUNTER — Encounter: Payer: Self-pay | Admitting: Nurse Practitioner

## 2015-04-04 VITALS — BP 138/96 | Ht 60.0 in | Wt 286.0 lb

## 2015-04-04 DIAGNOSIS — E039 Hypothyroidism, unspecified: Secondary | ICD-10-CM

## 2015-04-04 DIAGNOSIS — F419 Anxiety disorder, unspecified: Secondary | ICD-10-CM | POA: Diagnosis not present

## 2015-04-04 DIAGNOSIS — IMO0001 Reserved for inherently not codable concepts without codable children: Secondary | ICD-10-CM

## 2015-04-04 DIAGNOSIS — E119 Type 2 diabetes mellitus without complications: Secondary | ICD-10-CM

## 2015-04-04 DIAGNOSIS — F430 Acute stress reaction: Secondary | ICD-10-CM

## 2015-04-04 DIAGNOSIS — E785 Hyperlipidemia, unspecified: Secondary | ICD-10-CM

## 2015-04-04 DIAGNOSIS — R030 Elevated blood-pressure reading, without diagnosis of hypertension: Secondary | ICD-10-CM | POA: Diagnosis not present

## 2015-04-04 DIAGNOSIS — F411 Generalized anxiety disorder: Secondary | ICD-10-CM

## 2015-04-04 LAB — LIPID PANEL
CHOL/HDL RATIO: 3.8 ratio (ref 0.0–4.4)
Cholesterol, Total: 203 mg/dL — ABNORMAL HIGH (ref 100–199)
HDL: 53 mg/dL (ref >39)
LDL Calculated: 126 mg/dL — ABNORMAL HIGH (ref 0–99)
Triglycerides: 120 mg/dL (ref 0–149)
VLDL Cholesterol Cal: 24 mg/dL (ref 5–40)

## 2015-04-04 LAB — HEPATIC FUNCTION PANEL
ALT: 15 IU/L (ref 0–32)
AST: 16 IU/L (ref 0–40)
Albumin: 3.8 g/dL (ref 3.6–4.8)
Alkaline Phosphatase: 102 IU/L (ref 39–117)
BILIRUBIN TOTAL: 0.7 mg/dL (ref 0.0–1.2)
BILIRUBIN, DIRECT: 0.18 mg/dL (ref 0.00–0.40)
Total Protein: 6.5 g/dL (ref 6.0–8.5)

## 2015-04-04 LAB — HEMOGLOBIN A1C
Est. average glucose Bld gHb Est-mCnc: 143 mg/dL
Hgb A1c MFr Bld: 6.6 % — ABNORMAL HIGH (ref 4.8–5.6)

## 2015-04-04 MED ORDER — LISINOPRIL 5 MG PO TABS: 5.0000 mg | ORAL_TABLET | Freq: Every day | ORAL | Status: DC

## 2015-04-04 MED ORDER — PRAVASTATIN SODIUM 20 MG PO TABS: 20.0000 mg | ORAL_TABLET | Freq: Every day | ORAL | Status: DC

## 2015-04-04 MED ORDER — PHENTERMINE HCL 37.5 MG PO TABS: 37.5000 mg | ORAL_TABLET | Freq: Every day | ORAL | Status: DC

## 2015-04-04 MED ORDER — CITALOPRAM HYDROBROMIDE 20 MG PO TABS: ORAL_TABLET | ORAL | Status: DC

## 2015-04-04 MED ORDER — LEVOTHYROXINE SODIUM 88 MCG PO TABS: ORAL_TABLET | ORAL | Status: DC

## 2015-04-04 NOTE — Progress Notes (Signed)
Subjective:  Presents for routine follow-up. Has struggled greatly over the past 2 months, craving sugar and simple carbs. Lost her husband in October, emotionally states she is doing fairly well. Staying busy at work and involved with her family. Current dose of Celexa seems to be working well at this point. Has started walking some. Would like to restart phentermine, has taken this without difficulty in the past. No chest pain/ischemic type pain or shortness of breath. Rare swelling in the lower legs, tries to avoid taking her fluid pill due to secondary leg cramps.  Objective:   BP 138/96 mmHg  Ht 5' (1.524 m)  Wt 286 lb (129.729 kg)  BMI 55.86 kg/m2 NAD. Alert, oriented. TMs clear effusion, no erythema. Pharynx clear. Neck supple with mild soft anterior adenopathy. Lungs clear. Heart regular rate rhythm. Lower extremities trace pitting edema. Reviewed lipid profile liver profile from 2/4 with patient. Hemoglobin A1c is still pending.    Assessment:  Problem List Items Addressed This Visit      Endocrine   Diabetes mellitus type 2, uncomplicated (HCC) - Primary   Relevant Medications   pravastatin (PRAVACHOL) 20 MG tablet   lisinopril (PRINIVIL,ZESTRIL) 5 MG tablet   Hypothyroidism   Relevant Medications   levothyroxine (SYNTHROID, LEVOTHROID) 88 MCG tablet     Other   Anxiety as acute reaction to exceptional stress   Relevant Medications   citalopram (CELEXA) 20 MG tablet   Elevated blood pressure   Hyperlipidemia   Relevant Medications   pravastatin (PRAVACHOL) 20 MG tablet   lisinopril (PRINIVIL,ZESTRIL) 5 MG tablet   Morbid obesity (HCC)   Relevant Medications   phentermine (ADIPEX-P) 37.5 MG tablet     Plan:  Meds ordered this encounter  Medications  . citalopram (CELEXA) 20 MG tablet    Sig: one po qhs    Dispense:  90 tablet    Refill:  1    Order Specific Question:  Supervising Provider    Answer:  Merlyn Albert [[id number]]  . levothyroxine (SYNTHROID,  LEVOTHROID) 88 MCG tablet    Sig: Take 1 tablet by mouth once daily before breakfast    Dispense:  90 tablet    Refill:  1    Order Specific Question:  Supervising Provider    Answer:  Merlyn Albert [[id number]]  . pravastatin (PRAVACHOL) 20 MG tablet    Sig: Take 1 tablet (20 mg total) by mouth daily.    Dispense:  90 tablet    Refill:  1    Order Specific Question:  Supervising Provider    Answer:  Merlyn Albert [[id number]]  . phentermine (ADIPEX-P) 37.5 MG tablet    Sig: Take 1 tablet (37.5 mg total) by mouth daily before breakfast.    Dispense:  30 tablet    Refill:  2    Order Specific Question:  Supervising Provider    Answer:  Merlyn Albert [[id number]]  . lisinopril (PRINIVIL,ZESTRIL) 5 MG tablet    Sig: Take 1 tablet (5 mg total) by mouth daily.    Dispense:  90 tablet    Refill:  1    Order Specific Question:  Supervising Provider    Answer:  Merlyn Albert [[id number]]   Add low-dose lisinopril to regimen, explanation given. Lengthy discussion regarding the importance of activity, weight loss particularly around the abdominal area, healthy diet low in sugar and simple carbs and stress reduction. Discussed multiple programs that may help patient. Return in about 3 months (around 07/02/2015)  for recheck.

## 2015-04-25 ENCOUNTER — Other Ambulatory Visit: Payer: Self-pay | Admitting: Nurse Practitioner

## 2015-04-25 ENCOUNTER — Telehealth: Payer: Self-pay | Admitting: Nurse Practitioner

## 2015-04-25 MED ORDER — AMOXICILLIN-POT CLAVULANATE 875-125 MG PO TABS: 1.0000 | ORAL_TABLET | Freq: Two times a day (BID) | ORAL | Status: DC

## 2015-04-25 MED ORDER — ALBUTEROL SULFATE HFA 108 (90 BASE) MCG/ACT IN AERS: 2.0000 | INHALATION_SPRAY | RESPIRATORY_TRACT | Status: DC | PRN

## 2015-04-25 NOTE — Telephone Encounter (Signed)
Inhaler and antibiotic sent in. Call back by end of the week if no better, sooner if worse.

## 2015-04-25 NOTE — Telephone Encounter (Signed)
Patient needs Rx for albuterol (PROVENTIL HFA;VENTOLIN HFA) 108 (90 BASE) MCG/ACT inhaler to [facility].    She was seen on 04/04/15 by Eber Jones but her symptoms were not that bad.  Now she is coughing her head off, congestion way worse, fever of 100.2, has been 100.4.  Can we call something in?

## 2015-04-25 NOTE — Telephone Encounter (Signed)
Left message on voicemail notifying patient  

## 2015-04-25 NOTE — Telephone Encounter (Signed)
Patient is having congestion (clear, no color), coughing, fatigue, fever. No wheezing or shortness of breath. Patient has taken Mucinex and Sudafed.

## 2015-04-27 ENCOUNTER — Encounter: Payer: Self-pay | Admitting: Nurse Practitioner

## 2015-04-27 ENCOUNTER — Encounter: Payer: Self-pay | Admitting: Family Medicine

## 2015-04-27 ENCOUNTER — Ambulatory Visit (INDEPENDENT_AMBULATORY_CARE_PROVIDER_SITE_OTHER): Payer: BC Managed Care – PPO | Admitting: Nurse Practitioner

## 2015-04-27 VITALS — BP 138/88 | Temp 99.0°F | Ht 60.0 in | Wt 278.8 lb

## 2015-04-27 DIAGNOSIS — R062 Wheezing: Secondary | ICD-10-CM

## 2015-04-27 DIAGNOSIS — J329 Chronic sinusitis, unspecified: Secondary | ICD-10-CM

## 2015-04-27 DIAGNOSIS — J111 Influenza due to unidentified influenza virus with other respiratory manifestations: Secondary | ICD-10-CM

## 2015-04-27 MED ORDER — ALBUTEROL SULFATE (2.5 MG/3ML) 0.083% IN NEBU
2.5000 mg | INHALATION_SOLUTION | Freq: Once | RESPIRATORY_TRACT | Status: AC
  Administered 2015-04-27: 2.5 mg via RESPIRATORY_TRACT

## 2015-04-27 NOTE — Progress Notes (Signed)
Subjective:   Presents for complaints of dizziness cough and congestion that began 4 days ago 22 days ago began running a fever of 101.5. No sore throat. Headache has improved. Frequent nonproductive cough, at first very frequent at nighttime, not as bad last night. Slight wheezing mainly at nighttime. Clear runny nose. No ear pain or sore throat. Fatigue. Feels weak. Is used her albuterol 3 times which has helped. Last dose was about 2-1/2 hours ago. Has not checked her sugar for a few days. Has a prescription for Augmentin, took 1 dose last night, has not taken 1 this morning.  Objective:   BP 138/88 mmHg  Temp(Src) 99 F (37.2 C) (Oral)  Ht 5' (1.524 m)  Wt 278 lb 12.8 oz (126.463 kg)  BMI 54.45 kg/m2  NAD. Alert, oriented. TMs clear effusion, no erythema. Pharynx nonerythematous with slightly green PND noted. Neck supple with mild soft anterior adenopathy. Lungs  Initially breath sounds mildly diminished in general, no tachypnea but some shortness of breath with talking. Given albuterol 2.5 mg nebulizer treatment. Airflow improved. 1 faint expiratory wheeze noted anterior only. Color normal limit. Heart regular rate rhythm.  Assessment: Rhinosinusitis  Wheezing - Plan: albuterol (PROVENTIL) (2.5 MG/3ML) 0.083% nebulizer solution 2.5 mg  Influenza  Plan:  Continue Augmentin as directed. OTC meds directed for congestion and cough. Callback in 48 hours if no improvement, sooner if worse. Warning signs were reviewed.

## 2015-04-29 ENCOUNTER — Other Ambulatory Visit: Payer: Self-pay | Admitting: Nurse Practitioner

## 2015-04-29 ENCOUNTER — Telehealth: Payer: Self-pay | Admitting: Nurse Practitioner

## 2015-04-29 MED ORDER — HYDROCODONE-HOMATROPINE 5-1.5 MG/5ML PO SYRP: 5.0000 mL | ORAL_SOLUTION | ORAL | Status: DC | PRN

## 2015-04-29 MED ORDER — PREDNISONE 20 MG PO TABS: ORAL_TABLET | ORAL | Status: DC

## 2015-04-29 NOTE — Telephone Encounter (Signed)
Pt called stating that her cough is not any better and [facility] to [facility] for her cough.          [facility] [place]

## 2015-04-29 NOTE — Telephone Encounter (Signed)
Patient notified rx printed for Hycodan; Prednisone sent in; sugars will go up temporarily; call next week if no better, go to ED sooner if worse. Patient verbalized understanding.

## 2015-04-29 NOTE — Telephone Encounter (Signed)
Nurses please call for update

## 2015-04-29 NOTE — Telephone Encounter (Signed)
Patient states that she wants the steriod taper and hycodan that was offered to her at her appointment the other day prescribed. She her fever has resolved but she is still coughing a lot.

## 2015-04-29 NOTE — Telephone Encounter (Signed)
Rx printed for Hycodan; Prednisone sent in; sugars will go up temporarily; call next week if no better, go to ED sooner if worse.

## 2015-07-04 ENCOUNTER — Ambulatory Visit: Payer: BC Managed Care – PPO | Admitting: Nurse Practitioner

## 2015-07-05 ENCOUNTER — Ambulatory Visit: Payer: BC Managed Care – PPO | Admitting: Nurse Practitioner

## 2015-08-23 ENCOUNTER — Other Ambulatory Visit: Payer: Self-pay | Admitting: Nurse Practitioner

## 2015-10-02 ENCOUNTER — Other Ambulatory Visit: Payer: Self-pay | Admitting: Nurse Practitioner

## 2015-11-01 ENCOUNTER — Other Ambulatory Visit: Payer: Self-pay | Admitting: Nurse Practitioner

## 2015-11-07 ENCOUNTER — Ambulatory Visit: Payer: BC Managed Care – PPO | Admitting: Nurse Practitioner

## 2015-11-09 ENCOUNTER — Ambulatory Visit (INDEPENDENT_AMBULATORY_CARE_PROVIDER_SITE_OTHER): Payer: BC Managed Care – PPO | Admitting: Nurse Practitioner

## 2015-11-09 ENCOUNTER — Encounter: Payer: Self-pay | Admitting: Nurse Practitioner

## 2015-11-09 VITALS — BP 126/84 | Ht 60.0 in | Wt 292.4 lb

## 2015-11-09 DIAGNOSIS — E039 Hypothyroidism, unspecified: Secondary | ICD-10-CM | POA: Diagnosis not present

## 2015-11-09 DIAGNOSIS — E785 Hyperlipidemia, unspecified: Secondary | ICD-10-CM | POA: Diagnosis not present

## 2015-11-09 DIAGNOSIS — Z23 Encounter for immunization: Secondary | ICD-10-CM

## 2015-11-09 DIAGNOSIS — F419 Anxiety disorder, unspecified: Secondary | ICD-10-CM

## 2015-11-09 DIAGNOSIS — E119 Type 2 diabetes mellitus without complications: Secondary | ICD-10-CM | POA: Diagnosis not present

## 2015-11-09 MED ORDER — LEVOTHYROXINE SODIUM 88 MCG PO TABS: ORAL_TABLET | ORAL | 1 refills | Status: DC

## 2015-11-09 MED ORDER — LISINOPRIL 5 MG PO TABS: 5.0000 mg | ORAL_TABLET | Freq: Every day | ORAL | 1 refills | Status: DC

## 2015-11-09 MED ORDER — HYDROCODONE-HOMATROPINE 5-1.5 MG/5ML PO SYRP: 5.0000 mL | ORAL_SOLUTION | ORAL | 0 refills | Status: DC | PRN

## 2015-11-09 MED ORDER — METFORMIN HCL 500 MG PO TABS: 500.0000 mg | ORAL_TABLET | Freq: Two times a day (BID) | ORAL | 2 refills | Status: DC

## 2015-11-09 MED ORDER — CITALOPRAM HYDROBROMIDE 20 MG PO TABS: ORAL_TABLET | ORAL | 1 refills | Status: DC

## 2015-11-09 MED ORDER — PRAVASTATIN SODIUM 20 MG PO TABS: 20.0000 mg | ORAL_TABLET | Freq: Every day | ORAL | 1 refills | Status: DC

## 2015-11-10 ENCOUNTER — Encounter: Payer: Self-pay | Admitting: Nurse Practitioner

## 2015-11-10 LAB — TSH: TSH: 1.3 u[IU]/mL (ref 0.450–4.500)

## 2015-11-10 NOTE — Progress Notes (Signed)
Subjective: presents for routine follow up. Has done poorly with her diet last 3-4 months. Recommend A1C but patient defers. Limited activity. No CP/ischemic type pain or unusual SOB. No edema. Craves sugar and simple carbs. Emotional eater. Has an intermittent cough, uses occasional Hycodan for this. Celexa working well for her anxiety. Is not currently on medication for her diabetes.  Objective:   BP 126/84   Ht 5' (1.524 m)   Wt 292 lb 6.4 oz (132.6 kg)   BMI 57.11 kg/m  NAD. Alert, oriented. TMs minimal clear effusion, no erythema. Pharynx clear. Neck supple with minimal adenopathy. Lungs clear. Heart regular rate rhythm. Abdomen obese soft nondistended nontender. Thyroid nontender to palpation, no mass or goiter noted. Lower extremities no edema. Reviewed labs with patient from 04/02/15. Defers further lab work at this time.  Assessment:  Problem List Items Addressed This Visit      Endocrine   Diabetes mellitus type 2, uncomplicated (HCC) - Primary   Relevant Medications   lisinopril (PRINIVIL,ZESTRIL) 5 MG tablet   pravastatin (PRAVACHOL) 20 MG tablet   metFORMIN (GLUCOPHAGE) 500 MG tablet   Hypothyroidism   Relevant Medications   levothyroxine (SYNTHROID, LEVOTHROID) 88 MCG tablet   Other Relevant Orders   TSH (Completed)     Other   Anxiety   Relevant Medications   citalopram (CELEXA) 20 MG tablet   Hyperlipidemia   Relevant Medications   lisinopril (PRINIVIL,ZESTRIL) 5 MG tablet   pravastatin (PRAVACHOL) 20 MG tablet   Morbid obesity (HCC)   Relevant Medications   metFORMIN (GLUCOPHAGE) 500 MG tablet    Other Visit Diagnoses    Need for vaccination       Relevant Orders   Flu Vaccine QUAD 36+ mos IM (Completed)   Pneumococcal polysaccharide vaccine 23-valent greater than or equal to 2yo subcutaneous/IM (Completed)     Plan:  Meds ordered this encounter  Medications  . HYDROcodone-homatropine (HYCODAN) 5-1.5 MG/5ML syrup    Sig: Take 5 mLs by mouth every 4  (four) hours as needed.    Dispense:  120 mL    Refill:  0    Order Specific Question:   Supervising Provider    Answer:   Merlyn AlbertLUKING, WILLIAM S [[id number]]  . citalopram (CELEXA) 20 MG tablet    Sig: one po qhs    Dispense:  90 tablet    Refill:  1    Order Specific Question:   Supervising Provider    Answer:   Merlyn AlbertLUKING, WILLIAM S [[id number]]  . levothyroxine (SYNTHROID, LEVOTHROID) 88 MCG tablet    Sig: TAKE ONE TABLET BY MOUTH ONCE DAILY BEFORE BREAKFAST    Dispense:  90 tablet    Refill:  1    Order Specific Question:   Supervising Provider    Answer:   Merlyn AlbertLUKING, WILLIAM S [[id number]]  . lisinopril (PRINIVIL,ZESTRIL) 5 MG tablet    Sig: Take 1 tablet (5 mg total) by mouth daily.    Dispense:  90 tablet    Refill:  1    Order Specific Question:   Supervising Provider    Answer:   Merlyn AlbertLUKING, WILLIAM S [[id number]]  . pravastatin (PRAVACHOL) 20 MG tablet    Sig: Take 1 tablet (20 mg total) by mouth daily.    Dispense:  90 tablet    Refill:  1    Order Specific Question:   Supervising Provider    Answer:   Merlyn AlbertLUKING, WILLIAM S [[id number]]  . metFORMIN (GLUCOPHAGE) 500 MG tablet    Sig:  Take 1 tablet (500 mg total) by mouth 2 (two) times daily with a meal.    Dispense:  60 tablet    Refill:  2    Order Specific Question:   Supervising Provider    Answer:   Merlyn Albert [[id number]]   Flu vaccine and Pneumovax today. Add metformin 500 mg once daily with meal, DC med and call if any problems. If tolerated try to increase to twice a day dosing. Lengthy discussion regarding the importance of dietary changes, activity and weight loss. Patient plans to try to make changes, our goal is for her to lose 5-10 pounds before her next visit. Lab work due at next visit. Strongly recommend preventive health physical. Return in about 4 months (around 03/10/2016) for recheck.

## 2015-12-01 ENCOUNTER — Telehealth: Payer: Self-pay | Admitting: Family Medicine

## 2015-12-01 NOTE — Telephone Encounter (Signed)
Did cramps start after beginning Metformin?

## 2015-12-01 NOTE — Telephone Encounter (Signed)
Eber JonesCarolyn to see plz just saw and managing

## 2015-12-01 NOTE — Telephone Encounter (Signed)
Patient was put on Metformin the last time she was seen.  She said at night she is now having bad cramps after she lays down.  She wants to know what she can do for this?

## 2015-12-02 NOTE — Telephone Encounter (Signed)
Discussed with pt. Pt verbalized understanding.  °

## 2015-12-02 NOTE — Telephone Encounter (Signed)
yes

## 2015-12-02 NOTE — Telephone Encounter (Signed)
Stop Metformin for the next 3-4 days and let me know next week how cramps are doing. Lets be sure its med causing this. If so, we may need to look at other choices

## 2015-12-06 ENCOUNTER — Telehealth: Payer: Self-pay | Admitting: Nurse Practitioner

## 2015-12-06 NOTE — Telephone Encounter (Signed)
Pt called stating that she has not had any leg cramps since coming off of the metformin. Pt is wanting to know what Eber JonesCarolyn is wanting her to do. Please advise.

## 2015-12-07 NOTE — Telephone Encounter (Signed)
Pt called back today checking on the status of the message.

## 2015-12-08 NOTE — Telephone Encounter (Signed)
Notified patient since her A1C is still below 7, recommend holding on other meds for now due to potential low BS and for some costs. Work hard on lifestyle measures such as 5-10 lbs weight loss; walking 30-45 min each day most days of the week; decreasing sugar and simple carbs in diet. We will recheck her A1C at her next visit to see if med needs to be started. Also we can refer to her to dietician to help with diabetes. Patient verbalized understanding.

## 2015-12-08 NOTE — Telephone Encounter (Signed)
Since her A1C is still below 7, I recommend holding on other meds for now due to potential low BS and for some costs. Work hard on lifestyle measures such as 5-10 lbs weight loss; walking 30-45 min each day most days of the week; decreasing sugar and simple carbs in diet. We will recheck her A1C at her next visit to see if med needs to be started. Also we can refer to her to dietician to help with diabetes.

## 2016-02-17 ENCOUNTER — Ambulatory Visit (INDEPENDENT_AMBULATORY_CARE_PROVIDER_SITE_OTHER): Payer: BC Managed Care – PPO | Admitting: Nurse Practitioner

## 2016-02-17 VITALS — BP 148/92 | Temp 98.7°F | Wt 295.4 lb

## 2016-02-17 DIAGNOSIS — R062 Wheezing: Secondary | ICD-10-CM | POA: Diagnosis not present

## 2016-02-17 DIAGNOSIS — J329 Chronic sinusitis, unspecified: Secondary | ICD-10-CM

## 2016-02-17 MED ORDER — ALBUTEROL SULFATE HFA 108 (90 BASE) MCG/ACT IN AERS: 2.0000 | INHALATION_SPRAY | RESPIRATORY_TRACT | 2 refills | Status: DC | PRN

## 2016-02-17 MED ORDER — HYDROCODONE-HOMATROPINE 5-1.5 MG/5ML PO SYRP: 5.0000 mL | ORAL_SOLUTION | ORAL | 0 refills | Status: DC | PRN

## 2016-02-17 MED ORDER — AMOXICILLIN-POT CLAVULANATE 875-125 MG PO TABS
1.0000 | ORAL_TABLET | Freq: Two times a day (BID) | ORAL | 0 refills | Status: DC
Start: 2016-02-17 — End: 2016-02-24

## 2016-02-17 MED ORDER — PREDNISONE 20 MG PO TABS: ORAL_TABLET | ORAL | 0 refills | Status: DC

## 2016-02-18 ENCOUNTER — Encounter: Payer: Self-pay | Admitting: Nurse Practitioner

## 2016-02-18 NOTE — Progress Notes (Signed)
Subjective:  Presents for c/o sinus symptoms x 4 weeks. Slight fever. Sore throat. Sinus pressure. Runny nose. Prolonged spells of coughing. Green sputum. Wheezing. Took 2 doses of Amoxil, was slightly better but ran out. No reflux or abd pain.   Objective:   BP (!) 148/92   Temp 98.7 F (37.1 C) (Oral)   Wt 295 lb 6.4 oz (134 kg)   BMI 57.69 kg/m  NAD. Alert, oriented. TMs clear effusion. Pharynx injected with PND noted. Neck supple with mild anterior adenopathy. Lungs clear but mildly diminished breath sounds. No tachypnea. Normal color. Heart RRR.   Assessment: Rhinosinusitis  Wheezing  Plan:  Meds ordered this encounter  Medications  . predniSONE (DELTASONE) 20 MG tablet    Sig: 3 po qd x 3 d then 2 po qd x 3 d then 1 po qd x 2d    Dispense:  17 tablet    Refill:  0    Order Specific Question:   Supervising Provider    Answer:   Merlyn AlbertLUKING, WILLIAM S [[id number]]  . HYDROcodone-homatropine (HYCODAN) 5-1.5 MG/5ML syrup    Sig: Take 5 mLs by mouth every 4 (four) hours as needed.    Dispense:  120 mL    Refill:  0    Order Specific Question:   Supervising Provider    Answer:   Merlyn AlbertLUKING, WILLIAM S [[id number]]  . amoxicillin-clavulanate (AUGMENTIN) 875-125 MG tablet    Sig: Take 1 tablet by mouth 2 (two) times daily.    Dispense:  20 tablet    Refill:  0    Order Specific Question:   Supervising Provider    Answer:   Merlyn AlbertLUKING, WILLIAM S [[id number]]  . albuterol (PROVENTIL HFA;VENTOLIN HFA) 108 (90 Base) MCG/ACT inhaler    Sig: Inhale 2 puffs into the lungs every 4 (four) hours as needed for wheezing.    Dispense:  1 Inhaler    Refill:  2    Order Specific Question:   Supervising Provider    Answer:   Merlyn AlbertLUKING, WILLIAM S [[id number]]   Patient understands steroids will increase glucose. Reviewed symptomatic care and warning signs. Call back if worsens or persists.

## 2016-02-24 ENCOUNTER — Other Ambulatory Visit: Payer: Self-pay | Admitting: Nurse Practitioner

## 2016-02-24 ENCOUNTER — Telehealth: Payer: Self-pay | Admitting: Nurse Practitioner

## 2016-02-24 MED ORDER — CEFDINIR 300 MG PO CAPS: 300.0000 mg | ORAL_CAPSULE | Freq: Two times a day (BID) | ORAL | 0 refills | Status: DC

## 2016-02-24 NOTE — Telephone Encounter (Signed)
Saw Ellen Ayers 12/22. Taking augmentin.pt states she about the same. Wheezing at night like she was when she came in, no worse, coughing spells that last about 20 mins, headache from cough, green mucus. Taking hycodan. Wants to know if she can get different antibiotic. Goes back to work next week and wants to be well.

## 2016-02-24 NOTE — Telephone Encounter (Signed)
Pt called stating that she is not getting any better. Pt is wanting to know if she needs to get a different antibiotic. Pt is also having coughing fits.     [facility] [place]

## 2016-02-24 NOTE — Telephone Encounter (Signed)
Sent in new antibiotic. Call back next week if no improvement.

## 2016-02-24 NOTE — Telephone Encounter (Signed)
Pt.notified

## 2016-03-09 ENCOUNTER — Ambulatory Visit: Payer: BC Managed Care – PPO | Admitting: Nurse Practitioner

## 2016-05-31 ENCOUNTER — Encounter: Payer: Self-pay | Admitting: Nurse Practitioner

## 2016-05-31 ENCOUNTER — Ambulatory Visit (INDEPENDENT_AMBULATORY_CARE_PROVIDER_SITE_OTHER): Payer: BC Managed Care – PPO | Admitting: Nurse Practitioner

## 2016-05-31 ENCOUNTER — Ambulatory Visit (HOSPITAL_COMMUNITY)
Admission: RE | Admit: 2016-05-31 | Discharge: 2016-05-31 | Disposition: A | Discharge status: Home / Self Care | Payer: Self-pay | Source: Ambulatory Visit | Attending: Nurse Practitioner | Admitting: Nurse Practitioner

## 2016-05-31 VITALS — BP 138/88 | Temp 99.6°F | Ht 60.0 in | Wt 300.0 lb

## 2016-05-31 DIAGNOSIS — R918 Other nonspecific abnormal finding of lung field: Secondary | ICD-10-CM | POA: Insufficient documentation

## 2016-05-31 DIAGNOSIS — J069 Acute upper respiratory infection, unspecified: Secondary | ICD-10-CM | POA: Diagnosis not present

## 2016-05-31 DIAGNOSIS — Z79899 Other long term (current) drug therapy: Secondary | ICD-10-CM

## 2016-05-31 DIAGNOSIS — R062 Wheezing: Secondary | ICD-10-CM

## 2016-05-31 DIAGNOSIS — E119 Type 2 diabetes mellitus without complications: Secondary | ICD-10-CM | POA: Diagnosis not present

## 2016-05-31 DIAGNOSIS — E559 Vitamin D deficiency, unspecified: Secondary | ICD-10-CM

## 2016-05-31 DIAGNOSIS — R05 Cough: Secondary | ICD-10-CM

## 2016-05-31 DIAGNOSIS — E785 Hyperlipidemia, unspecified: Secondary | ICD-10-CM

## 2016-05-31 DIAGNOSIS — B9689 Other specified bacterial agents as the cause of diseases classified elsewhere: Secondary | ICD-10-CM

## 2016-05-31 DIAGNOSIS — E039 Hypothyroidism, unspecified: Secondary | ICD-10-CM

## 2016-05-31 DIAGNOSIS — R053 Chronic cough: Secondary | ICD-10-CM

## 2016-05-31 DIAGNOSIS — M8580 Other specified disorders of bone density and structure, unspecified site: Secondary | ICD-10-CM | POA: Insufficient documentation

## 2016-05-31 MED ORDER — HYDROCODONE-HOMATROPINE 5-1.5 MG/5ML PO SYRP: 5.0000 mL | ORAL_SOLUTION | ORAL | 0 refills | Status: DC | PRN

## 2016-05-31 MED ORDER — FLUTICASONE FUROATE-VILANTEROL 100-25 MCG/INH IN AEPB: 1.0000 | INHALATION_SPRAY | Freq: Every day | RESPIRATORY_TRACT | 0 refills | Status: DC

## 2016-05-31 MED ORDER — LEVOTHYROXINE SODIUM 88 MCG PO TABS: ORAL_TABLET | ORAL | 0 refills | Status: DC

## 2016-05-31 MED ORDER — ALBUTEROL SULFATE (2.5 MG/3ML) 0.083% IN NEBU: INHALATION_SOLUTION | RESPIRATORY_TRACT | 2 refills | Status: DC

## 2016-05-31 MED ORDER — FLUTICASONE FUROATE-VILANTEROL 100-25 MCG/INH IN AEPB: 1.0000 | INHALATION_SPRAY | Freq: Every day | RESPIRATORY_TRACT | 2 refills | Status: DC

## 2016-05-31 MED ORDER — CEFDINIR 300 MG PO CAPS: 300.0000 mg | ORAL_CAPSULE | Freq: Two times a day (BID) | ORAL | 0 refills | Status: DC

## 2016-05-31 MED ORDER — CITALOPRAM HYDROBROMIDE 20 MG PO TABS: ORAL_TABLET | ORAL | 0 refills | Status: DC

## 2016-05-31 MED ORDER — LISINOPRIL 5 MG PO TABS: 5.0000 mg | ORAL_TABLET | Freq: Every day | ORAL | 0 refills | Status: DC

## 2016-05-31 MED ORDER — ALBUTEROL SULFATE (2.5 MG/3ML) 0.083% IN NEBU
2.5000 mg | INHALATION_SOLUTION | Freq: Once | RESPIRATORY_TRACT | Status: AC
  Administered 2016-05-31: 2.5 mg via RESPIRATORY_TRACT

## 2016-05-31 MED ORDER — PRAVASTATIN SODIUM 20 MG PO TABS: 20.0000 mg | ORAL_TABLET | Freq: Every day | ORAL | 0 refills | Status: DC

## 2016-05-31 NOTE — Patient Instructions (Signed)
Go to [email or web address] for savings coupons

## 2016-06-01 ENCOUNTER — Telehealth: Payer: Self-pay | Admitting: Family Medicine

## 2016-06-01 NOTE — Telephone Encounter (Signed)
If symtoms for greater than five days, yes, if fiv e or less, coud still spread the original virus(carokyn did not dict

## 2016-06-01 NOTE — Telephone Encounter (Signed)
Patient seen Ellen Ayers yesterday for bacterial URI.  She is having her grandchildren over this weekend and would like to know if she is contagious.

## 2016-06-01 NOTE — Telephone Encounter (Signed)
Spoke with patient and advised that she is still contagious and could pass the virus to her grandchildren. She voiced understanding.

## 2016-06-02 ENCOUNTER — Encounter: Payer: Self-pay | Admitting: Nurse Practitioner

## 2016-06-02 NOTE — Progress Notes (Signed)
Subjective:  Presents for c/o persistent cough since October. Will get better for awhile with antibiotics then come back. Began having some color to sputum 4 days ago. Frequent cough. No wheezing but chest tightness at times. No unusual edema. No orthopnea. Cough is better when she is still or stays cool. No acid reflux or HB. No abd pain. Occasional post tussive gagging. Increased fatigue due to trouble sleeping related to cough. No history of smoking.   Objective:   BP 138/88   Temp 99.6 F (37.6 C) (Oral)   Ht 5' (1.524 m)   Wt 300 lb (136.1 kg)   SpO2 97%   BMI 58.59 kg/m  NAD. Alert, oriented. TMs clear effusion with air bubbles in right ear. No erythema. Pharynx injected with PND noted. Neck supple with mild anterior adenopathy. Lungs initially mildly diminished air flow in general with rare faint expiratory wheeze. No tachypnea but mild SOB with talking. Given albuterol 2.5 mg neb treatment. Airflow improved with subjective improvement of symptoms. Scattered expiratory crackles. Heart RRR.   Assessment:  Bacterial upper respiratory infection - Plan: DG Chest 2 View  Persistent cough - Plan: DG Chest 2 View, DG Chest 2 View, DG Chest 2 View  Wheezing - Plan: albuterol (PROVENTIL) (2.5 MG/3ML) 0.083% nebulizer solution 2.5 mg, DG Chest 2 View, DG Chest 2 View, DG Chest 2 View  Hypothyroidism, unspecified type - Plan: TSH  Type 2 diabetes mellitus without complication, without long-term current use of insulin (HCC) - Plan: Hemoglobin A1c, Microalbumin / creatinine urine ratio  Hyperlipidemia, unspecified hyperlipidemia type - Plan: Lipid panel  Vitamin D deficiency - Plan: VITAMIN D 25 Hydroxy (Vit-D Deficiency, Fractures)  High risk medication use - Plan: Hepatic function panel, Basic metabolic panel    Plan:  Chest xray pending. Meds ordered this encounter  Medications  . albuterol (PROVENTIL) (2.5 MG/3ML) 0.083% nebulizer solution 2.5 mg  . fluticasone furoate-vilanterol  (BREO ELLIPTA) 100-25 MCG/INH AEPB    Sig: Inhale 1 puff into the lungs daily.    Dispense:  28 each    Refill:  2    Order Specific Question:   Supervising Provider    Answer:   Merlyn Albert [[id number]]  . fluticasone furoate-vilanterol (BREO ELLIPTA) 100-25 MCG/INH AEPB    Sig: Inhale 1 puff into the lungs daily.    Dispense:  28 each    Refill:  0    Order Specific Question:   Supervising Provider    Answer:   Merlyn Albert [[id number]]  . albuterol (PROVENTIL) (2.5 MG/3ML) 0.083% nebulizer solution    Sig: Use every 4 hours as needed for wheezing or prolonged cough    Dispense:  25 vial    Refill:  2    Order Specific Question:   Supervising Provider    Answer:   Merlyn Albert [[id number]]  . HYDROcodone-homatropine (HYCODAN) 5-1.5 MG/5ML syrup    Sig: Take 5 mLs by mouth every 4 (four) hours as needed.    Dispense:  120 mL    Refill:  0    Order Specific Question:   Supervising Provider    Answer:   Merlyn Albert [[id number]]  . citalopram (CELEXA) 20 MG tablet    Sig: one po qhs    Dispense:  90 tablet    Refill:  0    Order Specific Question:   Supervising Provider    Answer:   Merlyn Albert [[id number]]  . levothyroxine (SYNTHROID, LEVOTHROID) 88 MCG tablet  Sig: TAKE ONE TABLET BY MOUTH ONCE DAILY BEFORE BREAKFAST    Dispense:  90 tablet    Refill:  0    Order Specific Question:   Supervising Provider    Answer:   Merlyn Albert [[id number]]  . lisinopril (PRINIVIL,ZESTRIL) 5 MG tablet    Sig: Take 1 tablet (5 mg total) by mouth daily.    Dispense:  90 tablet    Refill:  0    Order Specific Question:   Supervising Provider    Answer:   Merlyn Albert [[id number]]  . pravastatin (PRAVACHOL) 20 MG tablet    Sig: Take 1 tablet (20 mg total) by mouth daily.    Dispense:  90 tablet    Refill:  0    Order Specific Question:   Supervising Provider    Answer:   Merlyn Albert [[id number]]  . cefdinir (OMNICEF) 300 MG capsule    Sig: Take 1 capsule (300 mg total) by mouth 2 (two)  times daily.    Dispense:  20 capsule    Refill:  0    Order Specific Question:   Supervising Provider    Answer:   Merlyn Albert [[id number]]   Add Breo to regimen. No indication for oral steroids at this time and would like to avoid if possible due to diabetes. Given refills on meds.  Return in about 3 months (around 08/30/2016). For recheck and labs. Call back in 2-3 weeks if no improvement in cough, will switch Lisinopril as a precaution and refer to asthma/allergy specialist.

## 2016-06-20 ENCOUNTER — Ambulatory Visit (HOSPITAL_COMMUNITY)
Admission: RE | Admit: 2016-06-20 | Discharge: 2016-06-20 | Disposition: A | Discharge status: Home / Self Care | Payer: BC Managed Care – PPO | Source: Ambulatory Visit | Attending: Nurse Practitioner | Admitting: Nurse Practitioner

## 2016-06-20 DIAGNOSIS — R05 Cough: Secondary | ICD-10-CM | POA: Diagnosis present

## 2016-06-20 DIAGNOSIS — R062 Wheezing: Secondary | ICD-10-CM | POA: Insufficient documentation

## 2016-06-22 ENCOUNTER — Telehealth: Payer: Self-pay | Admitting: Family Medicine

## 2016-06-22 ENCOUNTER — Encounter: Payer: Self-pay | Admitting: Nurse Practitioner

## 2016-06-22 NOTE — Telephone Encounter (Signed)
Message sent through my chart

## 2016-06-22 NOTE — Telephone Encounter (Signed)
Written by Campbell Riches, NP on 06/20/2016 4:55 PM  Ellen Ayers, good news. Chest xray is normal. Hope you have started [name] and seen some improvement. Take care, Ellen Ayers  Patient verbalized understanding and stated she stopped Breo because it was caused he thrush even when she rinsed he mouth and brushed teeth after use. Patient stated she is still using breathing breathing treatments and wants to know how long she is supposed to use them.

## 2016-06-22 NOTE — Telephone Encounter (Signed)
She is using it as prescribed several times a day

## 2016-06-22 NOTE — Telephone Encounter (Signed)
How often is she doing albuterol treatments at this point?

## 2016-06-22 NOTE — Telephone Encounter (Signed)
Patient is requesting results to recent chest x-ray.

## 2016-06-24 ENCOUNTER — Encounter: Payer: Self-pay | Admitting: Nurse Practitioner

## 2016-06-26 ENCOUNTER — Other Ambulatory Visit: Payer: Self-pay | Admitting: Nurse Practitioner

## 2016-06-26 ENCOUNTER — Encounter: Payer: Self-pay | Admitting: Nurse Practitioner

## 2016-06-26 MED ORDER — BUDESONIDE-FORMOTEROL FUMARATE 80-4.5 MCG/ACT IN AERO: 2.0000 | INHALATION_SPRAY | Freq: Two times a day (BID) | RESPIRATORY_TRACT | 12 refills | Status: DC

## 2016-06-26 MED ORDER — HYDROCODONE-HOMATROPINE 5-1.5 MG/5ML PO SYRP: 5.0000 mL | ORAL_SOLUTION | ORAL | 0 refills | Status: DC | PRN

## 2016-07-09 ENCOUNTER — Encounter: Payer: Self-pay | Admitting: Nurse Practitioner

## 2016-08-27 ENCOUNTER — Ambulatory Visit: Payer: BC Managed Care – PPO | Admitting: Nurse Practitioner

## 2016-09-02 ENCOUNTER — Other Ambulatory Visit: Payer: Self-pay | Admitting: Nurse Practitioner

## 2016-10-14 ENCOUNTER — Other Ambulatory Visit: Payer: Self-pay | Admitting: Nurse Practitioner

## 2016-11-12 LAB — BASIC METABOLIC PANEL WITH GFR
BUN/Creatinine Ratio: 16 (ref 12–28)
BUN: 14 mg/dL (ref 8–27)
CO2: 22 mmol/L (ref 20–29)
Calcium: 9.2 mg/dL (ref 8.7–10.3)
Chloride: 98 mmol/L (ref 96–106)
Creatinine, Ser: 0.86 mg/dL (ref 0.57–1.00)
GFR calc Af Amer: 83 mL/min/1.73
GFR calc non Af Amer: 72 mL/min/1.73
Glucose: 206 mg/dL — ABNORMAL HIGH (ref 65–99)
Potassium: 4.5 mmol/L (ref 3.5–5.2)
Sodium: 138 mmol/L (ref 134–144)

## 2016-11-12 LAB — HEPATIC FUNCTION PANEL
ALT: 19 IU/L (ref 0–32)
AST: 18 IU/L (ref 0–40)
Albumin: 4 g/dL (ref 3.6–4.8)
Alkaline Phosphatase: 100 IU/L (ref 39–117)
Bilirubin Total: 0.9 mg/dL (ref 0.0–1.2)
Bilirubin, Direct: 0.22 mg/dL (ref 0.00–0.40)
Total Protein: 7 g/dL (ref 6.0–8.5)

## 2016-11-12 LAB — TSH: TSH: 2.66 u[IU]/mL (ref 0.450–4.500)

## 2016-11-12 LAB — MICROALBUMIN / CREATININE URINE RATIO
Creatinine, Urine: 214 mg/dL
Microalb/Creat Ratio: 7.8 mg/g creat (ref 0.0–30.0)
Microalbumin, Urine: 16.7 ug/mL

## 2016-11-12 LAB — LIPID PANEL
CHOLESTEROL TOTAL: 173 mg/dL (ref 100–199)
Chol/HDL Ratio: 4 ratio (ref 0.0–4.4)
HDL: 43 mg/dL (ref >39)
LDL CALC: 102 mg/dL — AB (ref 0–99)
TRIGLYCERIDES: 141 mg/dL (ref 0–149)
VLDL CHOLESTEROL CAL: 28 mg/dL (ref 5–40)

## 2016-11-12 LAB — VITAMIN D 25 HYDROXY (VIT D DEFICIENCY, FRACTURES): Vit D, 25-Hydroxy: 20.4 ng/mL — ABNORMAL LOW (ref 30.0–100.0)

## 2016-11-12 LAB — HEMOGLOBIN A1C
Est. average glucose Bld gHb Est-mCnc: 180 mg/dL
Hgb A1c MFr Bld: 7.9 % — ABNORMAL HIGH (ref 4.8–5.6)

## 2016-11-15 ENCOUNTER — Other Ambulatory Visit: Payer: Self-pay | Admitting: Nurse Practitioner

## 2016-11-16 ENCOUNTER — Encounter: Payer: Self-pay | Admitting: Nurse Practitioner

## 2016-11-16 ENCOUNTER — Ambulatory Visit (INDEPENDENT_AMBULATORY_CARE_PROVIDER_SITE_OTHER): Payer: BC Managed Care – PPO | Admitting: Nurse Practitioner

## 2016-11-16 VITALS — BP 130/84 | Ht 60.0 in | Wt 289.0 lb

## 2016-11-16 DIAGNOSIS — Z23 Encounter for immunization: Secondary | ICD-10-CM | POA: Diagnosis not present

## 2016-11-16 DIAGNOSIS — E119 Type 2 diabetes mellitus without complications: Secondary | ICD-10-CM | POA: Diagnosis not present

## 2016-11-16 DIAGNOSIS — F419 Anxiety disorder, unspecified: Secondary | ICD-10-CM | POA: Diagnosis not present

## 2016-11-16 DIAGNOSIS — E559 Vitamin D deficiency, unspecified: Secondary | ICD-10-CM

## 2016-11-16 DIAGNOSIS — G4762 Sleep related leg cramps: Secondary | ICD-10-CM | POA: Diagnosis not present

## 2016-11-16 MED ORDER — CYCLOBENZAPRINE HCL 10 MG PO TABS: 10.0000 mg | ORAL_TABLET | Freq: Every day | ORAL | 0 refills | Status: DC

## 2016-11-16 NOTE — Telephone Encounter (Signed)
ERROR

## 2016-11-18 ENCOUNTER — Encounter: Payer: Self-pay | Admitting: Nurse Practitioner

## 2016-11-18 MED ORDER — VITAMIN D (ERGOCALCIFEROL) 1.25 MG (50000 UNIT) PO CAPS: 50000.0000 [IU] | ORAL_CAPSULE | ORAL | 2 refills | Status: DC

## 2016-11-18 NOTE — Progress Notes (Signed)
Subjective:  Presents for recheck on her diabetes. Has done very poorly with her diet lately. Very high in sugar and simple carbs. Limited activity. Has noticed more fatigue lately. States she can fall asleep if she sits for a period of time. No CP/ischemic type pain or unusual SOB. Tried Metformin on 2 occasions but had to stop due to intense nocturnal leg cramps. Still has them at times but not every night and not as intense. Mainly if she has been active that day. Would like to wean off Celexa. Has noticed some flat affect. Denies any symptoms of OSA. No pain, burning or numbness in the feet. Significant central obesity.   Objective:   BP 130/84   Ht 5' (1.524 m)   Wt 289 lb (131.1 kg)   BMI 56.44 kg/m  NAD. Alert, oriented. Lungs clear. Heart RRR.  Recent Results (from the past 2160 hour(s))  Lipid panel     Status: Abnormal   Collection Time: 11/10/16  9:29 AM  Result Value Ref Range   Cholesterol, Total 173 100 - 199 mg/dL   Triglycerides 409 0 - 149 mg/dL   HDL 43 >81 mg/dL   VLDL Cholesterol Cal 28 5 - 40 mg/dL   LDL Calculated 191 (H) 0 - 99 mg/dL   Chol/HDL Ratio 4.0 0.0 - 4.4 ratio    Comment:                                   T. Chol/HDL Ratio                                             Men  Women                               1/2 Avg.Risk  3.4    3.3                                   Avg.Risk  5.0    4.4                                2X Avg.Risk  9.6    7.1                                3X Avg.Risk 23.4   11.0   Hepatic function panel     Status: None   Collection Time: 11/10/16  9:29 AM  Result Value Ref Range   Total Protein 7.0 6.0 - 8.5 g/dL   Albumin 4.0 3.6 - 4.8 g/dL   Bilirubin Total 0.9 0.0 - 1.2 mg/dL   Bilirubin, Direct 4.78 0.00 - 0.40 mg/dL   Alkaline Phosphatase 100 39 - 117 IU/L   AST 18 0 - 40 IU/L   ALT 19 0 - 32 IU/L  TSH     Status: None   Collection Time: 11/10/16  9:29 AM  Result Value Ref Range   TSH 2.660 0.450 - 4.500 uIU/mL  VITAMIN D 25  Hydroxy (Vit-D Deficiency, Fractures)     Status: Abnormal  Collection Time: 11/10/16  9:29 AM  Result Value Ref Range   Vit D, 25-Hydroxy 20.4 (L) 30.0 - 100.0 ng/mL    Comment: Vitamin D deficiency has been defined by the Institute of Medicine and an Endocrine Society practice guideline as a level of serum 25-OH vitamin D less than 20 ng/mL (1,2). The Endocrine Society went on to further define vitamin D insufficiency as a level between 21 and 29 ng/mL (2). 1. IOM (Institute of Medicine). 2010. Dietary reference    intakes for calcium and D. [place]: The    [employer]. 2. Holick MF, Binkley [place], Bischoff-Ferrari HA, et al.    Evaluation, treatment, and prevention of vitamin D    deficiency: an Endocrine Society clinical practice    guideline. JCEM. 2011 Jul; 96(7):1911-30.   Hemoglobin A1c     Status: Abnormal   Collection Time: 11/10/16  9:29 AM  Result Value Ref Range   Hgb A1c MFr Bld 7.9 (H) 4.8 - 5.6 %    Comment:          Prediabetes: 5.7 - 6.4          Diabetes: >6.4          Glycemic control for adults with diabetes: <7.0    Est. average glucose Bld gHb Est-mCnc 180 mg/dL  Microalbumin / creatinine urine ratio     Status: None   Collection Time: 11/10/16  9:29 AM  Result Value Ref Range   Creatinine, Urine 214.0 Not Estab. mg/dL   Albumin, Urine 40.9 Not Estab. ug/mL   Microalb/Creat Ratio 7.8 0.0 - 30.0 mg/g creat  Basic metabolic panel     Status: Abnormal   Collection Time: 11/10/16  9:29 AM  Result Value Ref Range   Glucose 206 (H) 65 - 99 mg/dL   BUN 14 8 - 27 mg/dL   Creatinine, Ser 8.11 0.57 - 1.00 mg/dL   GFR calc non Af Amer 72 >59 mL/min/1.73   GFR calc Af Amer 83 >59 mL/min/1.73   BUN/Creatinine Ratio 16 12 - 28   Sodium 138 134 - 144 mmol/L   Potassium 4.5 3.5 - 5.2 mmol/L   Chloride 98 96 - 106 mmol/L   CO2 22 20 - 29 mmol/L   Calcium 9.2 8.7 - 10.3 mg/dL   Significant increase in FBS from 140 to 206. Vitamin D level improved  but still low. A1C from 6.6 to 7.9. Diabetic Foot Exam - Simple   Simple Foot Form Diabetic Foot exam was performed with the following findings:  Yes 11/16/2016  1:30 PM  Visual Inspection No deformities, no ulcerations, no other skin breakdown bilaterally:  Yes Sensation Testing Intact to touch and monofilament testing bilaterally:  Yes Pulse Check See comments:  Yes Comments DP pulses present bilat; toes warm with normal cap refill      Assessment:   Problem List Items Addressed This Visit      Endocrine   Diabetes mellitus type 2, uncomplicated (HCC) - Primary     Other   Anxiety   Morbid obesity (HCC)   Vitamin D deficiency    Other Visit Diagnoses    Nocturnal leg cramps       Need for influenza vaccination       Relevant Orders   Flu Vaccine QUAD 36+ mos IM (Completed)        Plan:   Meds ordered this encounter  Medications  . cyclobenzaprine (FLEXERIL) 10 MG tablet    Sig: Take 1  tablet (10 mg total) by mouth at bedtime. Prn cramps    Dispense:  30 tablet    Refill:  0    Order Specific Question:   Supervising Provider    Answer:   Merlyn Albert [[id number]]  . Vitamin D, Ergocalciferol, (DRISDOL) 50000 units CAPS capsule    Sig: Take 1 capsule (50,000 Units total) by mouth every 7 (seven) days.    Dispense:  4 capsule    Refill:  2    Order Specific Question:   Supervising Provider    Answer:   Merlyn Albert [[id number]]    Discussed importance of activity such as walking, reducing sugar and simple carbs and weight loss. Recent fatigue most likely related to elevation in sugar. Discussed potential problems associated with uncontrolled diabetes and options. Patient wants to go back to her healthy habits and recheck A1C at next visit. If no significant improvement, agrees to look at medication options at that time. Encouraged to monitor BS at home. Wean off celexa. Discussed signs of serotonin withdrawal. Recommend eye exam and preventive health physical to  discuss PAP and mammogram. Patient states she does not worry about it unless it is a problem. Discussed importance of preventive care.  Return in about 3 months (around 02/15/2017) for diabetes check up.

## 2016-11-21 ENCOUNTER — Other Ambulatory Visit: Payer: Self-pay | Admitting: Nurse Practitioner

## 2016-11-30 ENCOUNTER — Other Ambulatory Visit: Payer: Self-pay | Admitting: Nurse Practitioner

## 2017-02-09 ENCOUNTER — Other Ambulatory Visit: Payer: Self-pay | Admitting: Nurse Practitioner

## 2017-03-01 ENCOUNTER — Ambulatory Visit: Payer: BC Managed Care – PPO | Admitting: Nurse Practitioner

## 2017-03-05 ENCOUNTER — Other Ambulatory Visit: Payer: Self-pay | Admitting: Nurse Practitioner

## 2017-03-22 ENCOUNTER — Encounter: Payer: Self-pay | Admitting: Nurse Practitioner

## 2017-03-22 ENCOUNTER — Ambulatory Visit: Payer: BC Managed Care – PPO | Admitting: Nurse Practitioner

## 2017-03-22 VITALS — BP 118/82 | Ht 60.0 in | Wt 270.6 lb

## 2017-03-22 DIAGNOSIS — E119 Type 2 diabetes mellitus without complications: Secondary | ICD-10-CM | POA: Diagnosis not present

## 2017-03-22 LAB — POCT GLYCOSYLATED HEMOGLOBIN (HGB A1C): HEMOGLOBIN A1C: 6.9

## 2017-03-22 MED ORDER — HYDROCODONE-HOMATROPINE 5-1.5 MG/5ML PO SYRP: 5.0000 mL | ORAL_SOLUTION | ORAL | 0 refills | Status: DC | PRN

## 2017-03-22 MED ORDER — PHENTERMINE HCL 37.5 MG PO TABS: 37.5000 mg | ORAL_TABLET | Freq: Every day | ORAL | 2 refills | Status: DC

## 2017-03-24 ENCOUNTER — Encounter: Payer: Self-pay | Admitting: Nurse Practitioner

## 2017-03-24 NOTE — Addendum Note (Signed)
Addended by: Campbell RichesHOSKINS, CAROLYN C on: 03/24/2017 07:31 PM   Modules accepted: Level of Service

## 2017-03-24 NOTE — Progress Notes (Addendum)
Subjective:  Presents for recheck on her diabetes. Doing much better with her diet. Does not check sugar at home. Doing better with her weight loss. Could not take Metformin due to severe leg cramps. Breathing and cough much improved. Has stopped symbicort for now. Still having some night time cough. Takes occasional Hycodan syrup. Still has a small amount left but requesting refill. Would like to restart Phentermine. Has taken this fine in the past. Feeling better emotionally. Considering retiring in a few months.  [facility] screen [facility] 2/9 03/24/2017  Decreased Interest 2  Down, Depressed, Hopeless 1  PHQ - 2 Score 3  Altered sleeping 1  Change in appetite 3  Feeling bad or failure about yourself  1  Trouble concentrating 0  Moving slowly or fidgety/restless 0  Suicidal thoughts 0  PHQ-9 Score 8     Objective:   BP 118/82   Ht 5' (1.524 m)   Wt 270 lb 9.6 oz (122.7 kg)   BMI 52.85 kg/m  NAD. Alert, oriented. Lungs clear. Heart RRR. Has lost 19 lbs since September.  Results for orders placed or performed in visit on 03/22/17  POCT HgB A1C  Result Value Ref Range   Hemoglobin A1C 6.9    A1C is down from 7.9 Diabetic Foot Exam - Simple   Simple Foot Form Diabetic Foot exam was performed with the following findings:  Yes 03/22/2017  9:00 AM  Visual Inspection No deformities, no ulcerations, no other skin breakdown bilaterally:  Yes Sensation Testing Intact to touch and monofilament testing bilaterally:  Yes Pulse Check See comments:  Yes Comments Normal cap refill; DP pulses present bilat; bunion noted left great toe      Assessment:   Problem List Items Addressed This Visit      Endocrine   Diabetes mellitus type 2, uncomplicated (HCC) - Primary   Relevant Orders   POCT HgB A1C (Completed)     Other   Morbid obesity (HCC)   Relevant Medications   phentermine (ADIPEX-P) 37.5 MG tablet       Plan:   Meds ordered this encounter  Medications  . phentermine  (ADIPEX-P) 37.5 MG tablet    Sig: Take 1 tablet (37.5 mg total) by mouth daily before breakfast.    Dispense:  30 tablet    Refill:  2    Order Specific Question:   Supervising Provider    Answer:   Merlyn AlbertLUKING, WILLIAM S [[id number]]  . HYDROcodone-homatropine (HYCODAN) 5-1.5 MG/5ML syrup    Sig: Take 5 mLs by mouth every 4 (four) hours as needed.    Dispense:  120 mL    Refill:  0    Order Specific Question:   Supervising Provider    Answer:   Riccardo DubinLUKING, WILLIAM S [[id number]]   Defers other diabetes medicine for now. Will revisit at her next OV. Continue weight loss efforts. Gradually add activity such as walking. Patient will consider PE.  Return in about 3 months (around 06/20/2017) for diabetes recheck.  25 minutes was spent with the patient. Greater than half the time was spent in discussion and answering questions and counseling regarding the issues that the patient came in for today.

## 2017-03-26 ENCOUNTER — Encounter: Payer: Self-pay | Admitting: Nurse Practitioner

## 2017-03-26 ENCOUNTER — Telehealth: Payer: Self-pay | Admitting: Family Medicine

## 2017-03-26 ENCOUNTER — Other Ambulatory Visit: Payer: Self-pay | Admitting: Nurse Practitioner

## 2017-03-26 MED ORDER — CEFDINIR 300 MG PO CAPS: 300.0000 mg | ORAL_CAPSULE | Freq: Two times a day (BID) | ORAL | 0 refills | Status: DC

## 2017-03-26 NOTE — Telephone Encounter (Signed)
Please advise 

## 2017-03-26 NOTE — Telephone Encounter (Signed)
I spoke with the pt she states she is still having some issues. She is having a productive cough,headache,sore throat,stopped up nose with green discharge,fluid in both ears no fever. She is aware we have no availability today for her and she wanted me to just send a note back to see what you suggest. Please advise.

## 2017-03-26 NOTE — Telephone Encounter (Signed)
Patient saw Eber JonesCarolyn a few days ago for a regular office visit. She called to see if Eber JonesCarolyn had gotten her email and I told her Eber JonesCarolyn was not here today.  She wanted to see if someone else could address the email.  She said she told Eber JonesCarolyn all about her cough.  I [facility] her [facility] have [facility]  Uses [facility] [place]

## 2017-03-26 NOTE — Telephone Encounter (Signed)
Antibiotic sent in earlier and patient advised.

## 2017-05-13 ENCOUNTER — Other Ambulatory Visit: Payer: Self-pay | Admitting: Nurse Practitioner

## 2017-06-03 ENCOUNTER — Other Ambulatory Visit: Payer: Self-pay | Admitting: Nurse Practitioner

## 2017-06-21 ENCOUNTER — Ambulatory Visit: Payer: BC Managed Care – PPO | Admitting: Family Medicine

## 2017-06-21 ENCOUNTER — Ambulatory Visit: Payer: BC Managed Care – PPO | Admitting: Nurse Practitioner

## 2017-07-05 ENCOUNTER — Ambulatory Visit: Payer: BC Managed Care – PPO | Admitting: Nurse Practitioner

## 2017-07-19 ENCOUNTER — Ambulatory Visit: Payer: BC Managed Care – PPO | Admitting: Nurse Practitioner

## 2017-07-19 ENCOUNTER — Encounter: Payer: Self-pay | Admitting: Nurse Practitioner

## 2017-07-19 VITALS — BP 138/90 | Ht 60.0 in | Wt 266.6 lb

## 2017-07-19 DIAGNOSIS — F419 Anxiety disorder, unspecified: Secondary | ICD-10-CM

## 2017-07-19 DIAGNOSIS — E785 Hyperlipidemia, unspecified: Secondary | ICD-10-CM | POA: Diagnosis not present

## 2017-07-19 DIAGNOSIS — Z1211 Encounter for screening for malignant neoplasm of colon: Secondary | ICD-10-CM | POA: Diagnosis not present

## 2017-07-19 DIAGNOSIS — E119 Type 2 diabetes mellitus without complications: Secondary | ICD-10-CM | POA: Diagnosis not present

## 2017-07-19 LAB — POCT GLYCOSYLATED HEMOGLOBIN (HGB A1C): Hemoglobin A1C: 6.3 % — AB (ref 4.0–5.6)

## 2017-07-19 NOTE — Progress Notes (Signed)
Subjective:  Presents for recheck on her diabetes. Doing much better with diet and weight loss. Has cut down on sugar and simple carbs. No CP/ischemic type pain or SOB. Anxiety stable on Celexa. Adherent to medication regimen.   Objective:   BP 138/90   Ht 5' (1.524 m)   Wt 266 lb 9.6 oz (120.9 kg)   BMI 52.07 kg/m  NAD. Alert, oriented. Lungs clear. Heart RRR.  Results for orders placed or performed in visit on 07/19/17  POCT HgB A1C  Result Value Ref Range   Hemoglobin A1C 6.3 (A) 4.0 - 5.6 %   HbA1c, POC (prediabetic range)  5.7 - 6.4 %   HbA1c, POC (controlled diabetic range)  0.0 - 7.0 %   A1C continues to improve from 7.9 and 6.9 from last visits.   Assessment:   Problem List Items Addressed This Visit      Endocrine   Diabetes mellitus type 2, uncomplicated (HCC) - Primary   Relevant Orders   POCT HgB A1C (Completed)   POCT HgB A1C (Completed)   Hepatic function panel     Other   Anxiety   Hyperlipidemia   Relevant Orders   Hepatic function panel   Lipid Profile    Other Visit Diagnoses    Screening for colon cancer       Relevant Orders   IFOBT POC (occult bld, rslt in office)       Plan:    Continue dietary, activity and weight loss efforts. Strongly recommend wellness exam. Patient will consider. Labs ordered. Discussed colonoscopy. Agrees to iFOBT for now.  Return in about 4 months (around 11/19/2017) for Diabetes check up.

## 2017-07-19 NOTE — Patient Instructions (Signed)
USP label [email or web address]

## 2017-07-20 ENCOUNTER — Encounter: Payer: Self-pay | Admitting: Nurse Practitioner

## 2017-07-25 ENCOUNTER — Encounter: Payer: Self-pay | Admitting: Nurse Practitioner

## 2017-07-25 ENCOUNTER — Other Ambulatory Visit: Payer: Self-pay | Admitting: Nurse Practitioner

## 2017-07-29 ENCOUNTER — Other Ambulatory Visit: Payer: Self-pay | Admitting: Nurse Practitioner

## 2017-07-29 MED ORDER — PHENTERMINE HCL 37.5 MG PO TABS: 37.5000 mg | ORAL_TABLET | Freq: Every day | ORAL | 2 refills | Status: DC

## 2017-07-29 MED ORDER — HYDROCODONE-HOMATROPINE 5-1.5 MG/5ML PO SYRP: 5.0000 mL | ORAL_SOLUTION | ORAL | 0 refills | Status: DC | PRN

## 2017-08-18 LAB — LIPID PANEL
CHOL/HDL RATIO: 4 ratio (ref 0.0–4.4)
Cholesterol, Total: 183 mg/dL (ref 100–199)
HDL: 46 mg/dL (ref >39)
LDL CALC: 114 mg/dL — AB (ref 0–99)
TRIGLYCERIDES: 116 mg/dL (ref 0–149)
VLDL Cholesterol Cal: 23 mg/dL (ref 5–40)

## 2017-08-18 LAB — HEPATIC FUNCTION PANEL
ALBUMIN: 3.9 g/dL (ref 3.6–4.8)
ALT: 11 IU/L (ref 0–32)
AST: 15 IU/L (ref 0–40)
Alkaline Phosphatase: 100 IU/L (ref 39–117)
BILIRUBIN TOTAL: 0.7 mg/dL (ref 0.0–1.2)
Bilirubin, Direct: 0.18 mg/dL (ref 0.00–0.40)
TOTAL PROTEIN: 6.2 g/dL (ref 6.0–8.5)

## 2017-08-31 ENCOUNTER — Other Ambulatory Visit: Payer: Self-pay | Admitting: Nurse Practitioner

## 2017-09-11 ENCOUNTER — Other Ambulatory Visit: Payer: Self-pay | Admitting: Nurse Practitioner

## 2017-09-11 ENCOUNTER — Encounter: Payer: Self-pay | Admitting: Nurse Practitioner

## 2017-09-11 MED ORDER — ALBUTEROL SULFATE HFA 108 (90 BASE) MCG/ACT IN AERS: 2.0000 | INHALATION_SPRAY | RESPIRATORY_TRACT | 2 refills | Status: DC | PRN

## 2017-09-11 MED ORDER — HYDROCODONE-HOMATROPINE 5-1.5 MG/5ML PO SYRP: 5.0000 mL | ORAL_SOLUTION | ORAL | 0 refills | Status: DC | PRN

## 2017-09-11 NOTE — Progress Notes (Signed)
I routed it to you. Thank you

## 2017-09-11 NOTE — Progress Notes (Unsigned)
PMP reviewed

## 2017-09-11 NOTE — Progress Notes (Signed)
Done

## 2017-10-07 IMAGING — DX DG CHEST 2V
2 series · 2 of 2 positions shown · non-contrast
Comparison: Chest x-ray of 05/31/2016

CLINICAL DATA: Persistent cough, shortness of breath, wheezing for
several months

EXAM:
CHEST  2 VIEW

[chest pa]
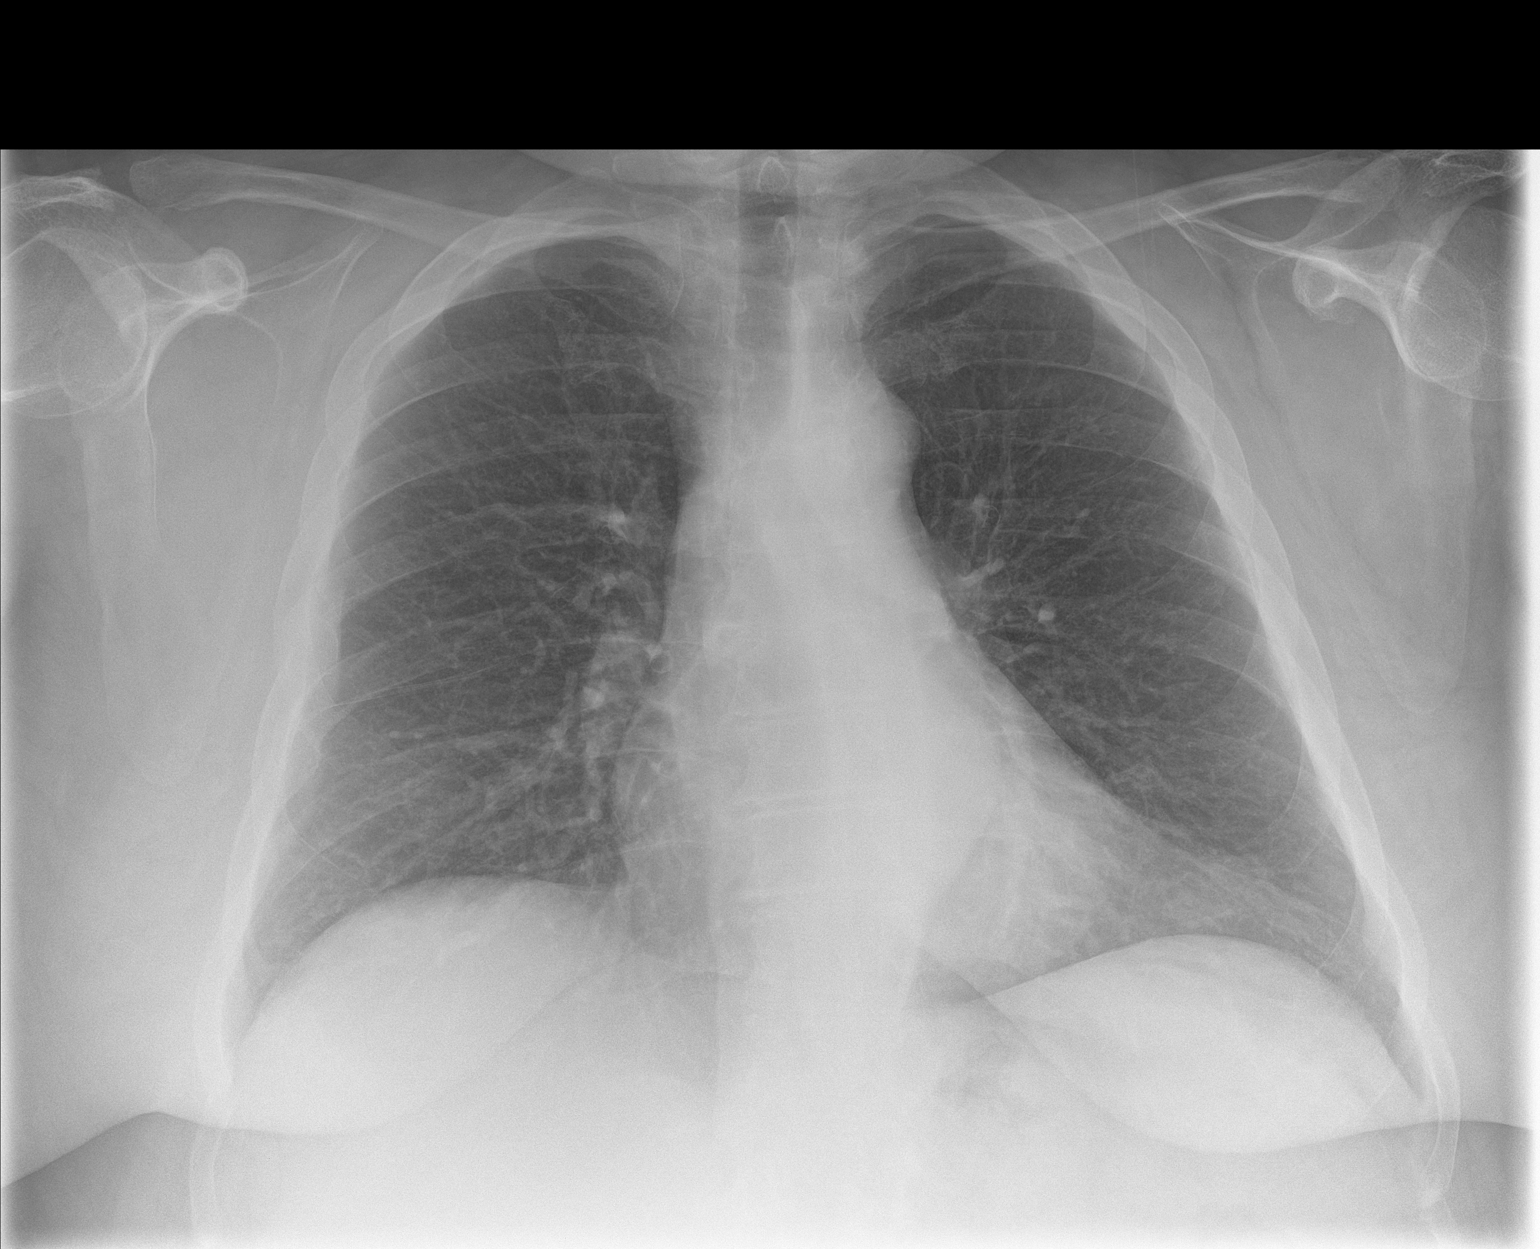

[chest lat]
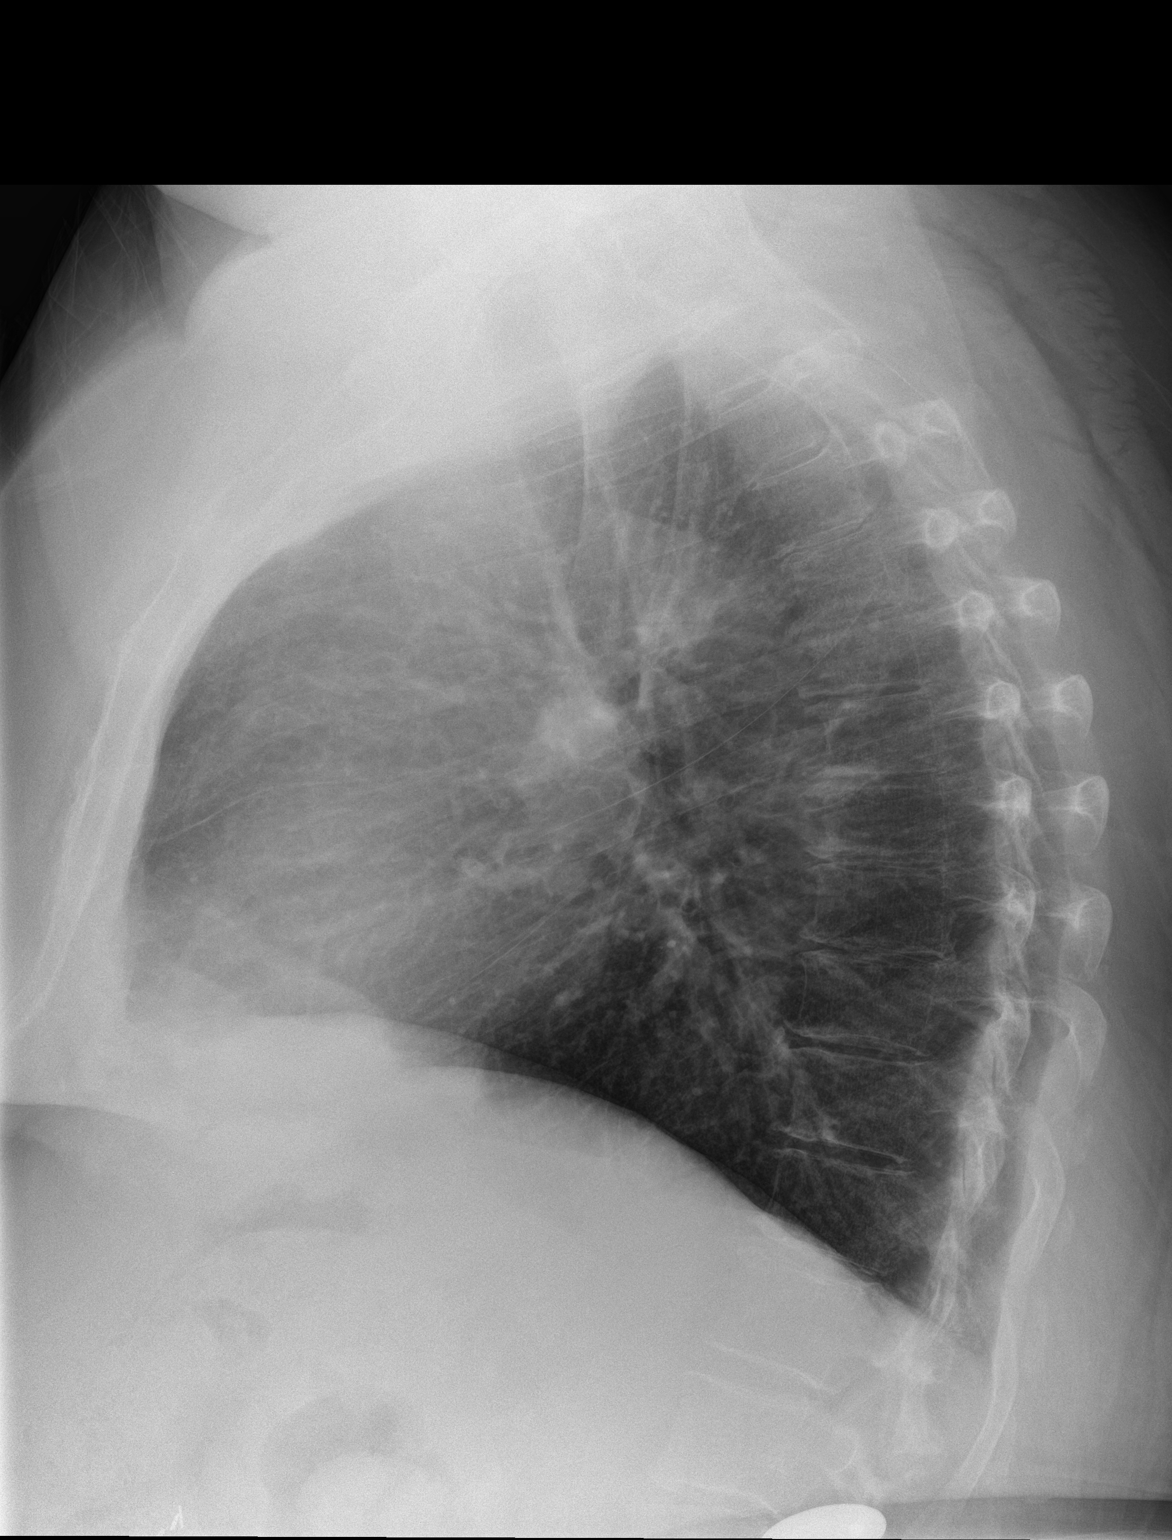

[2 of 2 positions shown; findings below may reference images not displayed]

FINDINGS: No pneumonia or effusion is seen. The opacity previously noted
within the lingula at has resolved. Mediastinal and hilar contours
are unremarkable. The heart is within upper limits of normal. No
bony abnormality is seen.
IMPRESSION: No active cardiopulmonary disease.

## 2017-11-15 ENCOUNTER — Ambulatory Visit: Payer: BC Managed Care – PPO | Admitting: Family Medicine

## 2017-12-20 ENCOUNTER — Ambulatory Visit: Payer: BC Managed Care – PPO | Admitting: Family Medicine

## 2017-12-20 ENCOUNTER — Encounter: Payer: Self-pay | Admitting: Family Medicine

## 2017-12-20 VITALS — BP 126/84 | Ht 60.0 in | Wt 270.0 lb

## 2017-12-20 DIAGNOSIS — E785 Hyperlipidemia, unspecified: Secondary | ICD-10-CM | POA: Diagnosis not present

## 2017-12-20 DIAGNOSIS — I10 Essential (primary) hypertension: Secondary | ICD-10-CM

## 2017-12-20 DIAGNOSIS — E119 Type 2 diabetes mellitus without complications: Secondary | ICD-10-CM

## 2017-12-20 DIAGNOSIS — E039 Hypothyroidism, unspecified: Secondary | ICD-10-CM

## 2017-12-20 DIAGNOSIS — Z79899 Other long term (current) drug therapy: Secondary | ICD-10-CM

## 2017-12-20 DIAGNOSIS — F418 Other specified anxiety disorders: Secondary | ICD-10-CM

## 2017-12-20 LAB — POCT GLYCOSYLATED HEMOGLOBIN (HGB A1C): Hemoglobin A1C: 5.9 % — AB (ref 4.0–5.6)

## 2017-12-20 MED ORDER — LISINOPRIL 5 MG PO TABS: 5.0000 mg | ORAL_TABLET | Freq: Every day | ORAL | 1 refills | Status: DC

## 2017-12-20 MED ORDER — CITALOPRAM HYDROBROMIDE 20 MG PO TABS: 20.0000 mg | ORAL_TABLET | Freq: Every day | ORAL | 1 refills | Status: DC

## 2017-12-20 MED ORDER — PHENTERMINE HCL 37.5 MG PO TABS: 37.5000 mg | ORAL_TABLET | Freq: Every day | ORAL | 1 refills | Status: DC

## 2017-12-20 MED ORDER — LEVOTHYROXINE SODIUM 88 MCG PO TABS: ORAL_TABLET | ORAL | 1 refills | Status: DC

## 2017-12-20 MED ORDER — PRAVASTATIN SODIUM 20 MG PO TABS: 20.0000 mg | ORAL_TABLET | Freq: Every day | ORAL | 1 refills | Status: DC

## 2017-12-20 NOTE — Progress Notes (Signed)
Subjective:    Patient ID: Ellen Ayers, female    DOB: 09-Apr-1955, 62 y.o.   MRN: [id number]  HPI Patient is here today to follow up on her chronic health issues.She states she eats healthy and does not see any specialists. She does not exercise.   She would like to come off of her antidepressant and the cramp medication is not working for her.   HTN: Usually compliant with medication, but occasionally misses doses.   HLD: compliant with medication, no adverse effects  Hypothyroid: Compliant with medication, takes at night, no adverse effects  Depression: would like to come off medication, reports emotions feels blunted; states she went down to half a tablet a year ago and had to go back up to a full tablet d/t return of depressive symptoms, denies SI/HI  Review of Systems  Constitutional: Negative for fever and unexpected weight change.  Respiratory: Negative for shortness of breath.   Cardiovascular: Negative for chest pain, palpitations and leg swelling.  Endocrine: Negative for cold intolerance, heat intolerance, polydipsia, polyphagia and polyuria.  Musculoskeletal: Negative for arthralgias and myalgias.  Neurological: Negative for weakness and headaches.       Results for orders placed or performed in visit on 12/20/17  POCT glycosylated hemoglobin (Hb A1C)  Result Value Ref Range   Hemoglobin A1C 5.9 (A) 4.0 - 5.6 %   HbA1c POC (<> result, manual entry)     HbA1c, POC (prediabetic range)     HbA1c, POC (controlled diabetic range)      Objective:   Physical Exam  Constitutional: She is oriented to person, place, and time. She appears well-developed and well-nourished. No distress.  HENT:  Head: Normocephalic and atraumatic.  Neck: Neck supple. No thyromegaly present.  Cardiovascular: Normal rate, regular rhythm and normal heart sounds.  No murmur heard. Pulmonary/Chest: Effort normal and breath sounds normal. No respiratory distress.  Musculoskeletal: She  exhibits no edema.  Lymphadenopathy:    She has no cervical adenopathy.  Neurological: She is alert and oriented to person, place, and time.  Skin: Skin is warm and dry.  Psychiatric: She has a normal mood and affect. Her behavior is normal. Thought content normal.  Nursing note and vitals reviewed.  Depression screen [facility] 2/9 12/20/2017 03/24/2017  Decreased Interest 1 2  Down, Depressed, Hopeless 1 1  PHQ - 2 Score 2 3  Altered sleeping 0 1  Tired, decreased energy 1 -  Change in appetite 0 3  Feeling bad or failure about yourself  1 1  Trouble concentrating 1 0  Moving slowly or fidgety/restless 0 0  Suicidal thoughts 0 0  PHQ-9 Score 5 8  Difficult doing work/chores Not difficult at all -         Assessment & Plan:  1. Type 2 diabetes mellitus without complication, without long-term current use of insulin ([place]) - Plan: POCT glycosylated hemoglobin (Hb A1C) Diabetes is currently well controlled with diet.  A1c today was 5.9.  Will obtain a urine microalbumin today.  Recommend continue diet control as well as increased physical activity.  She will follow-up in 6 months.  2. Hyperlipidemia, unspecified hyperlipidemia type We will obtain a lipid and liver profile today.  Continue current medication.  Refills given  3. Hypothyroidism, unspecified type Continue current medication will refill.  TSH today.  4. Hypertension, essential Blood pressure under good control, will continue current medication.  Refills given. MET 7 will be checked today as well.  5. Depression with anxiety  Patient would like to come off of antidepressant.  Recommend she cut down her medication to half a tablet daily and see how she does.  She will keep [place] up-to-date on her moods.  If stable after a couple months may consider coming off completely.  Patient is not suicidal.  6. Morbid Obesity Pt requesting refill of phentermine. Reports intermittent use only when she feels cravings. Denies adverse effects.  PMP checked. Discussed this with Dr. Richardson Landry, refilled with 1 refill. Discussed risks of long-term use and the need to come off of this in the future.  25 minutes was spent with the patient.  This statement verifies that 25 minutes was indeed spent with the patient.  More than 50% of this visit-total duration of the visit-was spent in counseling and coordination of care. The issues that the patient came in for today as reflected in the diagnosis (s) please refer to documentation for further details.  Dr. Richardson Landry was consulted on this case and is in agreement with the above treatment plan.

## 2017-12-21 LAB — BASIC METABOLIC PANEL
BUN / CREAT RATIO: 13 (ref 12–28)
BUN: 12 mg/dL (ref 8–27)
CALCIUM: 9.4 mg/dL (ref 8.7–10.3)
CO2: 21 mmol/L (ref 20–29)
CREATININE: 0.95 mg/dL (ref 0.57–1.00)
Chloride: 100 mmol/L (ref 96–106)
GFR calc Af Amer: 73 mL/min/{1.73_m2} (ref >59)
GFR, EST NON AFRICAN AMERICAN: 63 mL/min/{1.73_m2} (ref >59)
Glucose: 137 mg/dL — ABNORMAL HIGH (ref 65–99)
Potassium: 5 mmol/L (ref 3.5–5.2)
Sodium: 139 mmol/L (ref 134–144)

## 2017-12-21 LAB — MICROALBUMIN / CREATININE URINE RATIO
Creatinine, Urine: 275.4 mg/dL
MICROALBUM., U, RANDOM: 29.4 ug/mL
Microalb/Creat Ratio: 10.7 mg/g creat (ref 0.0–30.0)

## 2017-12-21 LAB — LIPID PANEL
Chol/HDL Ratio: 4.7 ratio — ABNORMAL HIGH (ref 0.0–4.4)
Cholesterol, Total: 207 mg/dL — ABNORMAL HIGH (ref 100–199)
HDL: 44 mg/dL (ref >39)
LDL CALC: 140 mg/dL — AB (ref 0–99)
Triglycerides: 113 mg/dL (ref 0–149)
VLDL Cholesterol Cal: 23 mg/dL (ref 5–40)

## 2017-12-21 LAB — TSH: TSH: 4.59 u[IU]/mL — AB (ref 0.450–4.500)

## 2017-12-21 LAB — HEPATIC FUNCTION PANEL
ALBUMIN: 4.2 g/dL (ref 3.6–4.8)
ALT: 16 IU/L (ref 0–32)
AST: 20 IU/L (ref 0–40)
Alkaline Phosphatase: 115 IU/L (ref 39–117)
BILIRUBIN TOTAL: 1 mg/dL (ref 0.0–1.2)
BILIRUBIN, DIRECT: 0.25 mg/dL (ref 0.00–0.40)
Total Protein: 7.3 g/dL (ref 6.0–8.5)

## 2017-12-27 MED ORDER — PRAVASTATIN SODIUM 40 MG PO TABS: 40.0000 mg | ORAL_TABLET | Freq: Every day | ORAL | 2 refills | Status: DC

## 2017-12-27 MED ORDER — LEVOTHYROXINE SODIUM 100 MCG PO TABS: 100.0000 ug | ORAL_TABLET | Freq: Every day | ORAL | 2 refills | Status: DC

## 2017-12-27 NOTE — Addendum Note (Signed)
Addended by: Meredith Leeds on: 12/27/2017 03:34 PM   Modules accepted: Orders

## 2018-03-21 ENCOUNTER — Ambulatory Visit: Payer: BC Managed Care – PPO | Admitting: Family Medicine

## 2018-04-19 LAB — LIPID PANEL
CHOL/HDL RATIO: 4 ratio (ref 0.0–4.4)
CHOLESTEROL TOTAL: 174 mg/dL (ref 100–199)
HDL: 43 mg/dL (ref >39)
LDL Calculated: 105 mg/dL — ABNORMAL HIGH (ref 0–99)
Triglycerides: 130 mg/dL (ref 0–149)
VLDL Cholesterol Cal: 26 mg/dL (ref 5–40)

## 2018-04-19 LAB — HEPATIC FUNCTION PANEL
ALK PHOS: 117 IU/L (ref 39–117)
ALT: 15 IU/L (ref 0–32)
AST: 18 IU/L (ref 0–40)
Albumin: 3.8 g/dL (ref 3.8–4.8)
BILIRUBIN, DIRECT: 0.18 mg/dL (ref 0.00–0.40)
Bilirubin Total: 0.6 mg/dL (ref 0.0–1.2)
TOTAL PROTEIN: 6.8 g/dL (ref 6.0–8.5)

## 2018-04-19 LAB — TSH: TSH: 0.151 u[IU]/mL — ABNORMAL LOW (ref 0.450–4.500)

## 2018-04-22 ENCOUNTER — Telehealth: Payer: Self-pay

## 2018-04-22 MED ORDER — LEVOTHYROXINE SODIUM 100 MCG PO TABS: ORAL_TABLET | ORAL | 5 refills | Status: DC

## 2018-04-22 NOTE — Telephone Encounter (Signed)
If pt feels she is doing well and doesn't have any questions about her recent lab results then we can cancel the appt on 2/28. Make sure she keeps the f/u appt scheduled in March. Thanks!

## 2018-04-22 NOTE — Telephone Encounter (Signed)
Patient was called with her results.  She wants to know if she still needs to come in on Friday 04/25/2018 to go over results.   Please advise.

## 2018-04-22 NOTE — Telephone Encounter (Signed)
Patient is aware of all . Please cancel patient appt on Friday 04/25/2018. Thanks.

## 2018-04-22 NOTE — Addendum Note (Signed)
Addended by: Meredith Leeds on: 04/22/2018 08:35 AM   Modules accepted: Orders

## 2018-04-25 ENCOUNTER — Ambulatory Visit: Payer: BC Managed Care – PPO | Admitting: Family Medicine

## 2018-05-23 ENCOUNTER — Ambulatory Visit: Payer: BC Managed Care – PPO | Admitting: Family Medicine

## 2018-05-23 ENCOUNTER — Other Ambulatory Visit: Payer: Self-pay

## 2018-05-23 ENCOUNTER — Encounter: Payer: Self-pay | Admitting: Family Medicine

## 2018-05-23 VITALS — BP 120/84 | Wt 279.0 lb

## 2018-05-23 DIAGNOSIS — R05 Cough: Secondary | ICD-10-CM

## 2018-05-23 DIAGNOSIS — E039 Hypothyroidism, unspecified: Secondary | ICD-10-CM | POA: Diagnosis not present

## 2018-05-23 DIAGNOSIS — E785 Hyperlipidemia, unspecified: Secondary | ICD-10-CM

## 2018-05-23 DIAGNOSIS — R059 Cough, unspecified: Secondary | ICD-10-CM

## 2018-05-23 DIAGNOSIS — E119 Type 2 diabetes mellitus without complications: Secondary | ICD-10-CM | POA: Diagnosis not present

## 2018-05-23 LAB — POCT GLYCOSYLATED HEMOGLOBIN (HGB A1C): Hemoglobin A1C: 7 % — AB (ref 4.0–5.6)

## 2018-05-23 MED ORDER — PRAVASTATIN SODIUM 40 MG PO TABS: 40.0000 mg | ORAL_TABLET | Freq: Every day | ORAL | 1 refills | Status: DC

## 2018-05-23 MED ORDER — LISINOPRIL 5 MG PO TABS: 5.0000 mg | ORAL_TABLET | Freq: Every day | ORAL | 1 refills | Status: DC

## 2018-05-23 MED ORDER — HYDROCODONE-HOMATROPINE 5-1.5 MG/5ML PO SYRP: 5.0000 mL | ORAL_SOLUTION | ORAL | 0 refills | Status: DC | PRN

## 2018-05-23 NOTE — Progress Notes (Signed)
Subjective:    Patient ID: Ellen Ayers, female    DOB: 26-Feb-1956, 63 y.o.   MRN: [id number]  Diabetes  She presents for her follow-up diabetic visit. She has type 2 diabetes mellitus. There are no hypoglycemic associated symptoms. Pertinent negatives for hypoglycemia include no dizziness or headaches. There are no diabetic associated symptoms. Pertinent negatives for diabetes include no chest pain, no polydipsia, no polyphagia and no polyuria. There are no hypoglycemic complications. There are no diabetic complications. Eye exam is current.  Hypertension  This is a chronic problem. Pertinent negatives include no chest pain, headaches, palpitations or shortness of breath. Risk factors for coronary artery disease include diabetes mellitus. There are no compliance problems.   Depression         Associated symptoms include no headaches and no suicidal ideas.  Depression: has stopped taking celexa. Denies SI/HI. States moods are doing well and wants to continue off of medication.   Diabetes: has done nothing but eat sugar at home since retirement. Is going to work on her diet. Is working on being more physically active.   Hypothyroidism: Doing well on current dose. Reports compliance with medication. Denies and adverse effects of s/s hypo/hyperthyroidism.  Pt retired in January. Having a hard time with transitioning to new schedule.    Reports sinus drainage that is irritating her throat and makes her cough - has been taking hycodan to help with this for a long time per patient. Reports problems coughing when lying down at night. No fever or shortness of breath. Taking claritin and mucinex.   Has not been taking symbicort - gave her thrush. Only taking albuterol prn.   Results for orders placed or performed in visit on 05/23/18  POCT HgB A1C  Result Value Ref Range   Hemoglobin A1C 7.0 (A) 4.0 - 5.6 %   HbA1c POC (<> result, manual entry)     HbA1c, POC (prediabetic range)     HbA1c,  POC (controlled diabetic range)      Review of Systems  Constitutional: Negative for chills and fever.  HENT: Positive for postnasal drip. Negative for congestion, ear pain, sinus pressure, sinus pain and sore throat.   Eyes: Negative for discharge and visual disturbance.  Respiratory: Positive for cough. Negative for shortness of breath and wheezing.   Cardiovascular: Negative for chest pain, palpitations and leg swelling.  Gastrointestinal: Negative for abdominal pain.  Endocrine: Negative for polydipsia, polyphagia and polyuria.  Neurological: Negative for dizziness, syncope and headaches.  Psychiatric/Behavioral: Positive for depression. Negative for dysphoric mood and suicidal ideas.       Objective:   Physical Exam Vitals signs and nursing note reviewed.  Constitutional:      General: She is not in acute distress.    Appearance: Normal appearance. She is obese. She is not toxic-appearing.  HENT:     Head: Normocephalic and atraumatic.     Right Ear: Tympanic membrane normal.     Left Ear: Tympanic membrane normal.     Nose: Nose normal.     Mouth/Throat:     Mouth: Mucous membranes are moist.     Pharynx: Oropharynx is clear.  Eyes:     General:        Right eye: No discharge.        Left eye: No discharge.  Neck:     Musculoskeletal: Neck supple. No neck rigidity.  Cardiovascular:     Rate and Rhythm: Normal rate and regular rhythm.  Heart sounds: Normal heart sounds.  Pulmonary:     Effort: Pulmonary effort is normal. No respiratory distress.     Breath sounds: Normal breath sounds. No wheezing or rales.  Musculoskeletal:     Right lower leg: No edema.     Left lower leg: No edema.  Lymphadenopathy:     Cervical: No cervical adenopathy.  Skin:    General: Skin is warm and dry.  Neurological:     Mental Status: She is alert and oriented to person, place, and time.  Psychiatric:        Mood and Affect: Mood normal.        Behavior: Behavior normal.     Depression screen [facility] 2/9 05/23/2018 12/20/2017 03/24/2017  Decreased Interest 3 1 2   Down, Depressed, Hopeless 1 1 1   PHQ - 2 Score 4 2 3   Altered sleeping 2 0 1  Tired, decreased energy 2 1 -  Change in appetite 3 0 3  Feeling bad or failure about yourself  0 1 1  Trouble concentrating 0 1 0  Moving slowly or fidgety/restless 0 0 0  Suicidal thoughts 0 0 0  PHQ-9 Score 11 5 8   Difficult doing work/chores Not difficult at all Not difficult at all -   GAD 7 : Generalized Anxiety Score 05/23/2018  Nervous, Anxious, on Edge 0  Control/stop worrying 0  Worry too much - different things 0  Trouble relaxing 0  Restless 0  Easily annoyed or irritable 0  Afraid - awful might happen 0  Total GAD 7 Score 0  Anxiety Difficulty Not difficult at all            Assessment & Plan:  1. Type 2 diabetes mellitus without complication, without long-term current use of insulin (HCC) - Plan: POCT HgB A1C A1c has increased to 7.0 today. Pt not on any medication currently. She admits to poor diet and exercise habits since her retirement in January. Discussed with patient that if her A1c gets any higher we will need to start medication. She is going to work hard on her diet and exercise over the next few months to get this under better control. Recommend f/u in 4 months to recheck A1c.   2. Hypothyroidism, unspecified type Compliant with medication, taking daily. Denies any problems. Will recheck TSH in May and make adjustments as needed.   3. Hyperlipidemia, unspecified hyperlipidemia type Compliant with medication, taking daily. No problems. Will refill. Encouraged healthy diet and exercise.   4. Depression and anxiety: Pt states she is doing well since coming off of her celexa. Having some difficulty adjusting to retirement. Is not interested in restarting medication currently. She is not suicidal. Discussed with pt that if she develops increasing depression symptoms she needs to call us and we  can restart medication. She verbalized understanding.   5. Cough: Pt with cough d/t PND and likely allergies. NAD on exam today. Lungs clear. Will refill her hycodan cough syrup per her request - PDMP database checked. Pt cautioned to only use sparingly and at night. She verbalized understanding. Pt has not tried any other antihistamines besides claritin. Recommended she try either generic zyrtec or allegra instead and see if she notices any improvement in her symptoms. Rare use of albuterol inhaler. She reports thrush when she was taking symbicort so stopped that. If her symptoms continue over the next couple weeks she should call and f/u. If notices progressive cough, fever, or difficulty breathing call immediately or go to  ED. Pt verbalized understanding.   Dr. Lilyan Punt was consulted on this case and is in agreement with the above treatment plan.

## 2018-05-26 ENCOUNTER — Other Ambulatory Visit: Payer: Self-pay | Admitting: Family Medicine

## 2018-07-11 ENCOUNTER — Telehealth: Payer: Self-pay | Admitting: Family Medicine

## 2018-07-11 MED ORDER — CITALOPRAM HYDROBROMIDE 20 MG PO TABS: 20.0000 mg | ORAL_TABLET | Freq: Every day | ORAL | 1 refills | Status: DC

## 2018-07-11 NOTE — Telephone Encounter (Signed)
Sure six mo worth 

## 2018-07-11 NOTE — Telephone Encounter (Signed)
Prescription sent electronically to pharmacy. Left message to return call to notify patient. 

## 2018-07-11 NOTE — Telephone Encounter (Signed)
Pt would like to go back on her citalopram (CELEXA) 20 MG tablet. She came off it on her own but where she is staying at home so much she is crying all the time. She would like to have a refill sent in to [id number] - [place], [place] - [phone]

## 2018-07-11 NOTE — Telephone Encounter (Signed)
Patient notified

## 2018-07-26 ENCOUNTER — Other Ambulatory Visit: Payer: Self-pay | Admitting: Family Medicine

## 2018-11-24 ENCOUNTER — Other Ambulatory Visit: Payer: Self-pay | Admitting: *Deleted

## 2018-11-24 MED ORDER — PRAVASTATIN SODIUM 40 MG PO TABS: 40.0000 mg | ORAL_TABLET | Freq: Every day | ORAL | 0 refills | Status: DC

## 2018-11-26 ENCOUNTER — Ambulatory Visit: Payer: BC Managed Care – PPO | Admitting: Family Medicine

## 2018-11-28 ENCOUNTER — Ambulatory Visit (INDEPENDENT_AMBULATORY_CARE_PROVIDER_SITE_OTHER): Payer: Medicare Other | Admitting: Nurse Practitioner

## 2018-11-28 ENCOUNTER — Encounter: Payer: Self-pay | Admitting: Nurse Practitioner

## 2018-11-28 DIAGNOSIS — E119 Type 2 diabetes mellitus without complications: Secondary | ICD-10-CM

## 2018-11-28 DIAGNOSIS — E039 Hypothyroidism, unspecified: Secondary | ICD-10-CM | POA: Diagnosis not present

## 2018-11-28 DIAGNOSIS — E785 Hyperlipidemia, unspecified: Secondary | ICD-10-CM | POA: Diagnosis not present

## 2018-11-28 DIAGNOSIS — R058 Other specified cough: Secondary | ICD-10-CM

## 2018-11-28 DIAGNOSIS — R05 Cough: Secondary | ICD-10-CM | POA: Diagnosis not present

## 2018-11-28 MED ORDER — HYDROCODONE-HOMATROPINE 5-1.5 MG/5ML PO SYRP: 5.0000 mL | ORAL_SOLUTION | ORAL | 0 refills | Status: DC | PRN

## 2018-11-28 NOTE — Progress Notes (Signed)
PHONE VISIT Subjective:    Patient ID: Ellen Ayers, female    DOB: 07/20/1955, 63 y.o.   MRN: [id number]  HPI Pt here today for medication refill. Pt states she would like a refill on Hycodan cough syrup. Pt states she takes Hycodan only if she develops a heavy cough and can not sleep. Pt states that she is doing ok.    Virtual Visit via Video Note  I connected with Ellen Ayers on 11/28/18 at  2:00 PM EDT by a video enabled telemedicine application and verified that I am speaking with the correct person using two identifiers.  Location: Patient: home Provider: office   I discussed the limitations of evaluation and management by telemedicine and the availability of in person appointments. The patient expressed understanding and agreed to proceed.  History of Present Illness: Presents for recheck and refills on her meds. Has cut back her Celexa 20 mg to 1/2 tab. no change in her diet overall.  Very limited activity due to the pandemic.  Uses occasional Hycodan syrup on a rare basis for cough.  No persistent cough.  No chest pain/ischemic type pain or unusual shortness of breath.  No edema.  Has an eye exam planned for the end of January.   Observations/Objective: Today's visit was via telephone Physical exam was not possible for this visit Alert, oriented.  Cheerful affect.  Thoughts logical coherent and relevant.  Assessment and Plan:   Follow Up Instructions: Problem List Items Addressed This Visit      Endocrine   Diabetes mellitus type 2, uncomplicated ([place]) - Primary   Relevant Orders   Basic Metabolic Panel (BMET)   Hepatic function panel   Lipid Profile   Hemoglobin A1c   Urine Microalbumin w/creat. ratio   Hypothyroidism   Relevant Orders   Basic Metabolic Panel (BMET)   Hepatic function panel   Lipid Profile   Hemoglobin A1c   Urine Microalbumin w/creat. ratio     Other   Hyperlipidemia   Relevant Orders   Basic Metabolic Panel (BMET)   Hepatic  function panel   Lipid Profile   Hemoglobin A1c   Urine Microalbumin w/creat. ratio    Other Visit Diagnoses    Occasional cough         Routine labs ordered.  Strongly recommend wellness exam.  Also recommend flu vaccine.  Discussed dietary measures and activity.  Continue to use hydrocodone cough syrup sparingly. Meds ordered this encounter  Medications  . HYDROcodone-homatropine (HYCODAN) 5-1.5 MG/5ML syrup    Sig: Take 5 mLs by mouth every 4 (four) hours as needed. Use sparingly. Caution drowsiness.    Dispense:  90 mL    Refill:  0    Order Specific Question:   Supervising Provider    Answer:   Kathyrn Drown [[id number]]    Return in about 3 months (around 02/28/2019) for diabetes check up.    I discussed the assessment and treatment plan with the patient. The patient was provided an opportunity to ask questions and all were answered. The patient agreed with the plan and demonstrated an understanding of the instructions.   The patient was advised to call back or seek an in-person evaluation if the symptoms worsen or if the condition fails to improve as anticipated.  I provided 15 minutes of non-face-to-face time during this encounter.       Review of Systems     Objective:   Physical Exam  Assessment & Plan:

## 2018-12-24 LAB — BASIC METABOLIC PANEL
BUN/Creatinine Ratio: 15 (ref 12–28)
BUN: 10 mg/dL (ref 8–27)
CO2: 28 mmol/L (ref 20–29)
Calcium: 9.3 mg/dL (ref 8.7–10.3)
Chloride: 101 mmol/L (ref 96–106)
Creatinine, Ser: 0.65 mg/dL (ref 0.57–1.00)
GFR calc Af Amer: 108 mL/min/{1.73_m2} (ref >59)
GFR calc non Af Amer: 93 mL/min/{1.73_m2} (ref >59)
Glucose: 227 mg/dL — ABNORMAL HIGH (ref 65–99)
Potassium: 5.3 mmol/L — ABNORMAL HIGH (ref 3.5–5.2)
Sodium: 141 mmol/L (ref 134–144)

## 2018-12-24 LAB — LIPID PANEL
Chol/HDL Ratio: 3.9 ratio (ref 0.0–4.4)
Cholesterol, Total: 181 mg/dL (ref 100–199)
HDL: 47 mg/dL (ref >39)
LDL Chol Calc (NIH): 107 mg/dL — ABNORMAL HIGH (ref 0–99)
Triglycerides: 153 mg/dL — ABNORMAL HIGH (ref 0–149)
VLDL Cholesterol Cal: 27 mg/dL (ref 5–40)

## 2018-12-24 LAB — HEPATIC FUNCTION PANEL
ALT: 13 IU/L (ref 0–32)
AST: 17 IU/L (ref 0–40)
Albumin: 3.8 g/dL (ref 3.8–4.8)
Alkaline Phosphatase: 128 IU/L — ABNORMAL HIGH (ref 39–117)
Bilirubin Total: 0.7 mg/dL (ref 0.0–1.2)
Bilirubin, Direct: 0.17 mg/dL (ref 0.00–0.40)
Total Protein: 6.4 g/dL (ref 6.0–8.5)

## 2018-12-24 LAB — HEMOGLOBIN A1C
Est. average glucose Bld gHb Est-mCnc: 235 mg/dL
Hgb A1c MFr Bld: 9.8 % — ABNORMAL HIGH (ref 4.8–5.6)

## 2018-12-24 LAB — MICROALBUMIN / CREATININE URINE RATIO
Creatinine, Urine: 91.5 mg/dL
Microalb/Creat Ratio: 11 mg/g creat (ref 0–29)
Microalbumin, Urine: 10.1 ug/mL

## 2019-01-09 ENCOUNTER — Other Ambulatory Visit: Payer: Self-pay

## 2019-01-09 ENCOUNTER — Ambulatory Visit (INDEPENDENT_AMBULATORY_CARE_PROVIDER_SITE_OTHER): Payer: Medicare Other | Admitting: Nurse Practitioner

## 2019-01-09 ENCOUNTER — Encounter: Payer: Self-pay | Admitting: Nurse Practitioner

## 2019-01-09 DIAGNOSIS — E785 Hyperlipidemia, unspecified: Secondary | ICD-10-CM

## 2019-01-09 DIAGNOSIS — E119 Type 2 diabetes mellitus without complications: Secondary | ICD-10-CM

## 2019-01-09 NOTE — Progress Notes (Signed)
PHONE Subjective:    Patient ID: Ellen HeadlandCheryl D Ayers, female    DOB: 10/26/1955, 63 y.o.   MRN: [id number]  HPI Pt is needing to discuss lab results. Pt had labs completed on 12/23/2018. Pt has lost 11 lbs, since she saw her lab results. Pt has decreased carbs and sugars. Pt denies drinking sodas or teas. Pt has started intermittent fasting and stops eating around 8pm. During quarantine, pt was eating a lot of candy and ice-cream. Pt has started moving around and walking more. Pt does not take any diabetes medication. Patient takes a daily 81 mg Aspirin. Is trying to eat healthier and exercise more. Pt has had annual foot exam and urine microalbumin. Pt has not had eye exam, will plan on scheduling.  ROS Cardiovascular: Denies chest pain or palpitations.   Virtual Visit via Telephone Note  I connected with Ellen Ayers on 01/09/19 at 10:20 AM EST by telephone and verified that I am speaking with the correct person using two identifiers.  Location: Patient: home Provider: office   I discussed the limitations, risks, security and privacy concerns of performing an evaluation and management service by telephone and the availability of in person appointments. I also discussed with the patient that there may be a patient responsible charge related to this service. The patient expressed understanding and agreed to proceed.    Observations/Objective: Today's visit was via telephone Physical exam was not possible for this visit  General: Alert and oriented. Cheerful affect. Recent Results (from the past 2160 hour(s))  Basic Metabolic Panel (BMET)     Status: Abnormal   Collection Time: 12/23/18 11:27 AM  Result Value Ref Range   Glucose 227 (H) 65 - 99 mg/dL   BUN 10 8 - 27 mg/dL   Creatinine, Ser 4.090.65 0.57 - 1.00 mg/dL   GFR calc non Af Amer 93 >59 mL/min/1.73   GFR calc Af Amer 108 >59 mL/min/1.73   BUN/Creatinine Ratio 15 12 - 28   Sodium 141 134 - 144 mmol/L   Potassium 5.3 (H) 3.5 -  5.2 mmol/L   Chloride 101 96 - 106 mmol/L   CO2 28 20 - 29 mmol/L   Calcium 9.3 8.7 - 10.3 mg/dL  Hepatic function panel     Status: Abnormal   Collection Time: 12/23/18 11:27 AM  Result Value Ref Range   Total Protein 6.4 6.0 - 8.5 g/dL   Albumin 3.8 3.8 - 4.8 g/dL   Bilirubin Total 0.7 0.0 - 1.2 mg/dL   Bilirubin, Direct 8.110.17 0.00 - 0.40 mg/dL   Alkaline Phosphatase 128 (H) 39 - 117 IU/L   AST 17 0 - 40 IU/L   ALT 13 0 - 32 IU/L  Lipid Profile     Status: Abnormal   Collection Time: 12/23/18 11:27 AM  Result Value Ref Range   Cholesterol, Total 181 100 - 199 mg/dL   Triglycerides 914153 (H) 0 - 149 mg/dL   HDL 47 >78>39 mg/dL   VLDL Cholesterol Cal 27 5 - 40 mg/dL   LDL Chol Calc (NIH) 295107 (H) 0 - 99 mg/dL   Chol/HDL Ratio 3.9 0.0 - 4.4 ratio    Comment:                                   T. Chol/HDL Ratio  Men  Women                               1/2 Avg.Risk  3.4    3.3                                   Avg.Risk  5.0    4.4                                2X Avg.Risk  9.6    7.1                                3X Avg.Risk 23.4   11.0   Hemoglobin A1c     Status: Abnormal   Collection Time: 12/23/18 11:27 AM  Result Value Ref Range   Hgb A1c MFr Bld 9.8 (H) 4.8 - 5.6 %    Comment:          Prediabetes: 5.7 - 6.4          Diabetes: >6.4          Glycemic control for adults with diabetes: <7.0    Est. average glucose Bld gHb Est-mCnc 235 mg/dL  Urine Microalbumin w/creat. ratio     Status: None   Collection Time: 12/23/18 11:27 AM  Result Value Ref Range   Creatinine, Urine 91.5 Not Estab. mg/dL   Microalbumin, Urine 82.4 Not Estab. ug/mL   Microalb/Creat Ratio 11 0 - 29 mg/g creat    Comment:                        Normal:                0 -  29                        Moderately increased: 30 - 300                        Severely increased:       >300   -Labs reviewed with patient.  Assessment and Plan: Problem List Items  Addressed This Visit      Endocrine   Diabetes mellitus type 2, uncomplicated (HCC) - Primary     Other   Hyperlipidemia      -Pt would like to try to improve hemoglobin A1c through conservative therapies, such as diet and exercise. Pt has already lost 11 lbs since receiving her labs. Educated about medication therapy, such as GLP-1 agonist. However, pt will like to hold off on medications at this time. Potassium elevated, mostly likely due to food intake. Educated about foods high in potassium. Discussed about slightly elevated LDL and triglycerides levels, pt to decrease fatty and fried foods. Pt is taking Pravastatin 40 mg, no adjustments needed at this time. Will plan on hemoglobin A1c, lipids, and CMP in 3 months to reevaluate.  Return in about 3 months (around 04/11/2019) for follow-up.   Follow Up Instructions:    I discussed the assessment and treatment plan with the patient. The patient was provided an opportunity to ask questions and all were answered. The patient agreed with the plan and demonstrated an  understanding of the instructions.   The patient was advised to call back or seek an in-person evaluation if the symptoms worsen or if the condition fails to improve as anticipated.  I provided 15 minutes of non-face-to-face time during this encounter.   Ramiro Harvest, RN

## 2019-01-09 NOTE — Progress Notes (Signed)
   Subjective:    Patient ID: Ellen Ayers, female    DOB: 04-06-1955, 63 y.o.   MRN: [id number]  HPI Pt is needing to discuss lab results. Pt had labs completed on 12/23/2018.  Virtual Visit via Telephone Note  I connected with Ellen Ayers on 01/09/19 at 10:20 AM EST by telephone and verified that I am speaking with the correct person using two identifiers.  Location: Patient: home Provider: office   I discussed the limitations, risks, security and privacy concerns of performing an evaluation and management service by telephone and the availability of in person appointments. I also discussed with the patient that there may be a patient responsible charge related to this service. The patient expressed understanding and agreed to proceed.   History of Present Illness:    Observations/Objective:   Assessment and Plan:   Follow Up Instructions:    I discussed the assessment and treatment plan with the patient. The patient was provided an opportunity to ask questions and all were answered. The patient agreed with the plan and demonstrated an understanding of the instructions.   The patient was advised to call back or seek an in-person evaluation if the symptoms worsen or if the condition fails to improve as anticipated.    Review of Systems     Objective:   Physical Exam        Assessment & Plan:

## 2019-01-10 ENCOUNTER — Encounter: Payer: Self-pay | Admitting: Nurse Practitioner

## 2019-02-17 ENCOUNTER — Other Ambulatory Visit: Payer: Self-pay | Admitting: Family Medicine

## 2019-03-09 ENCOUNTER — Telehealth: Payer: Self-pay | Admitting: Family Medicine

## 2019-03-09 MED ORDER — LISINOPRIL 5 MG PO TABS: 5.0000 mg | ORAL_TABLET | Freq: Every day | ORAL | 1 refills | Status: DC

## 2019-03-09 MED ORDER — LEVOTHYROXINE SODIUM 100 MCG PO TABS: 100.0000 ug | ORAL_TABLET | Freq: Every day | ORAL | 1 refills | Status: DC

## 2019-03-09 MED ORDER — CITALOPRAM HYDROBROMIDE 20 MG PO TABS: 20.0000 mg | ORAL_TABLET | Freq: Every day | ORAL | 1 refills | Status: DC

## 2019-03-09 NOTE — Telephone Encounter (Signed)
No, visit with carolyn fine, six mo meds

## 2019-03-09 NOTE — Telephone Encounter (Signed)
Pt seen 01/09/2019 for DM. Please advise. Thank you

## 2019-03-09 NOTE — Telephone Encounter (Signed)
Patient is requesting refills on lisinopril 5mg , citalopram 20 mg, and levothyroxine 100 mg called into [facility] . She has appointment with 2/12 to go over labs will she need a separate appointment for medication refill

## 2019-03-09 NOTE — Telephone Encounter (Signed)
Medication refills sent in and pt is aware

## 2019-03-13 ENCOUNTER — Telehealth: Payer: Self-pay | Admitting: Family Medicine

## 2019-03-13 NOTE — Telephone Encounter (Signed)
Pt has appt with Eber Jones on 2/12 and would like to get lab work done before appt.

## 2019-03-16 ENCOUNTER — Other Ambulatory Visit: Payer: Self-pay | Admitting: Nurse Practitioner

## 2019-03-16 DIAGNOSIS — E119 Type 2 diabetes mellitus without complications: Secondary | ICD-10-CM

## 2019-03-16 DIAGNOSIS — R748 Abnormal levels of other serum enzymes: Secondary | ICD-10-CM

## 2019-03-16 DIAGNOSIS — E785 Hyperlipidemia, unspecified: Secondary | ICD-10-CM

## 2019-03-16 DIAGNOSIS — E875 Hyperkalemia: Secondary | ICD-10-CM

## 2019-03-16 DIAGNOSIS — E039 Hypothyroidism, unspecified: Secondary | ICD-10-CM

## 2019-03-16 NOTE — Telephone Encounter (Signed)
Labs ordered.  Please notify patient.  Thanks.

## 2019-03-16 NOTE — Telephone Encounter (Signed)
Patient notified and verbalized understanding. 

## 2019-03-17 DIAGNOSIS — E785 Hyperlipidemia, unspecified: Secondary | ICD-10-CM | POA: Diagnosis not present

## 2019-03-17 DIAGNOSIS — R748 Abnormal levels of other serum enzymes: Secondary | ICD-10-CM | POA: Diagnosis not present

## 2019-03-17 DIAGNOSIS — E119 Type 2 diabetes mellitus without complications: Secondary | ICD-10-CM | POA: Diagnosis not present

## 2019-03-17 DIAGNOSIS — E875 Hyperkalemia: Secondary | ICD-10-CM | POA: Diagnosis not present

## 2019-03-17 DIAGNOSIS — E039 Hypothyroidism, unspecified: Secondary | ICD-10-CM | POA: Diagnosis not present

## 2019-03-18 LAB — LIPID PANEL
Chol/HDL Ratio: 4 ratio (ref 0.0–4.4)
Cholesterol, Total: 151 mg/dL (ref 100–199)
HDL: 38 mg/dL — ABNORMAL LOW (ref >39)
LDL Chol Calc (NIH): 85 mg/dL (ref 0–99)
Triglycerides: 158 mg/dL — ABNORMAL HIGH (ref 0–149)
VLDL Cholesterol Cal: 28 mg/dL (ref 5–40)

## 2019-03-18 LAB — HEMOGLOBIN A1C
Est. average glucose Bld gHb Est-mCnc: 126 mg/dL
Hgb A1c MFr Bld: 6 % — ABNORMAL HIGH (ref 4.8–5.6)

## 2019-03-18 LAB — COMPREHENSIVE METABOLIC PANEL
ALT: 11 IU/L (ref 0–32)
AST: 18 IU/L (ref 0–40)
Albumin/Globulin Ratio: 1.5 (ref 1.2–2.2)
Albumin: 4 g/dL (ref 3.8–4.8)
Alkaline Phosphatase: 125 IU/L — ABNORMAL HIGH (ref 39–117)
BUN/Creatinine Ratio: 18 (ref 12–28)
BUN: 12 mg/dL (ref 8–27)
Bilirubin Total: 0.8 mg/dL (ref 0.0–1.2)
CO2: 22 mmol/L (ref 20–29)
Calcium: 9.2 mg/dL (ref 8.7–10.3)
Chloride: 103 mmol/L (ref 96–106)
Creatinine, Ser: 0.68 mg/dL (ref 0.57–1.00)
GFR calc Af Amer: 106 mL/min/{1.73_m2} (ref >59)
GFR calc non Af Amer: 92 mL/min/{1.73_m2} (ref >59)
Globulin, Total: 2.7 g/dL (ref 1.5–4.5)
Glucose: 111 mg/dL — ABNORMAL HIGH (ref 65–99)
Potassium: 4.6 mmol/L (ref 3.5–5.2)
Sodium: 141 mmol/L (ref 134–144)
Total Protein: 6.7 g/dL (ref 6.0–8.5)

## 2019-03-18 LAB — TSH: TSH: 1.16 u[IU]/mL (ref 0.450–4.500)

## 2019-04-02 ENCOUNTER — Encounter: Payer: Self-pay | Admitting: Family Medicine

## 2019-04-10 ENCOUNTER — Ambulatory Visit (INDEPENDENT_AMBULATORY_CARE_PROVIDER_SITE_OTHER): Payer: Medicare PPO | Admitting: Nurse Practitioner

## 2019-04-10 ENCOUNTER — Other Ambulatory Visit: Payer: Self-pay

## 2019-04-10 DIAGNOSIS — E039 Hypothyroidism, unspecified: Secondary | ICD-10-CM | POA: Diagnosis not present

## 2019-04-10 DIAGNOSIS — E119 Type 2 diabetes mellitus without complications: Secondary | ICD-10-CM | POA: Diagnosis not present

## 2019-04-10 DIAGNOSIS — E785 Hyperlipidemia, unspecified: Secondary | ICD-10-CM

## 2019-04-10 NOTE — Progress Notes (Signed)
Phone visit Subjective:    Patient ID: Ellen Ayers, female    DOB: [birth year], 64 y.o.   MRN: [id number]  HPI discuss lab results. Pt states no concerns today.   Virtual Visit via Telephone Note  I connected with Ellen Ayers on 04/10/19 at 10:40 AM EST by telephone and verified that I am speaking with the correct person using two identifiers.  Location: Patient: home Provider: office   I discussed the limitations, risks, security and privacy concerns of performing an evaluation and management service by telephone and the availability of in person appointments. I also discussed with the patient that there may be a patient responsible charge related to this service. The patient expressed understanding and agreed to proceed.   History of Present Illness: Presents by phone today to discuss her most recent lab work.  Patient has been limiting her sugar and simple carbs.  Has lost 41 pounds since her last visit.  Regular eye exams.  No chest pain/ischemic type pain or shortness of breath.  Patient defers flu vaccine.  Also does not plan to get Covid vaccine.   Observations/Objective: Today's visit was via telephone Physical exam was not possible for this visit Alert, oriented.  Cheerful affect.  Thoughts logical coherent and relevant. Recent Results (from the past 2160 hour(s))  Comprehensive metabolic panel     Status: Abnormal   Collection Time: 03/17/19  9:15 AM  Result Value Ref Range   Glucose 111 (H) 65 - 99 mg/dL   BUN 12 8 - 27 mg/dL   Creatinine, Ser 0.68 0.57 - 1.00 mg/dL   GFR calc non Af Amer 92 >59 mL/min/1.73   GFR calc Af Amer 106 >59 mL/min/1.73   BUN/Creatinine Ratio 18 12 - 28   Sodium 141 134 - 144 mmol/L   Potassium 4.6 3.5 - 5.2 mmol/L   Chloride 103 96 - 106 mmol/L   CO2 22 20 - 29 mmol/L   Calcium 9.2 8.7 - 10.3 mg/dL   Total Protein 6.7 6.0 - 8.5 g/dL   Albumin 4.0 3.8 - 4.8 g/dL   Globulin, Total 2.7 1.5 - 4.5 g/dL   Albumin/Globulin Ratio 1.5 1.2 -  2.2   Bilirubin Total 0.8 0.0 - 1.2 mg/dL   Alkaline Phosphatase 125 (H) 39 - 117 IU/L   AST 18 0 - 40 IU/L   ALT 11 0 - 32 IU/L  Hemoglobin A1c     Status: Abnormal   Collection Time: 03/17/19  9:15 AM  Result Value Ref Range   Hgb A1c MFr Bld 6.0 (H) 4.8 - 5.6 %    Comment:          Prediabetes: 5.7 - 6.4          Diabetes: >6.4          Glycemic control for adults with diabetes: <7.0    Est. average glucose Bld gHb Est-mCnc 126 mg/dL  TSH     Status: None   Collection Time: 03/17/19  9:15 AM  Result Value Ref Range   TSH 1.160 0.450 - 4.500 uIU/mL  Lipid panel     Status: Abnormal   Collection Time: 03/17/19  9:15 AM  Result Value Ref Range   Cholesterol, Total 151 100 - 199 mg/dL   Triglycerides 158 (H) 0 - 149 mg/dL   HDL 38 (L) >39 mg/dL   VLDL Cholesterol Cal 28 5 - 40 mg/dL   LDL Chol Calc (NIH) 85 0 - 99 mg/dL   Chol/HDL  Ratio 4.0 0.0 - 4.4 ratio    Comment:                                   T. Chol/HDL Ratio                                             Men  Women                               1/2 Avg.Risk  3.4    3.3                                   Avg.Risk  5.0    4.4                                2X Avg.Risk  9.6    7.1                                3X Avg.Risk 23.4   11.0    Discussed recent labs which are greatly improved from her previous ones.  Assessment and Plan: Problem List Items Addressed This Visit      Endocrine   Diabetes mellitus type 2, uncomplicated (HCC) - Primary   Hypothyroidism     Other   Hyperlipidemia       Follow Up Instructions: Continue healthy habits.  Continue current medication regimen as directed. Return in about 3 months (around 07/08/2019). Call back sooner if any problems.   I discussed the assessment and treatment plan with the patient. The patient was provided an opportunity to ask questions and all were answered. The patient agreed with the plan and demonstrated an understanding of the instructions.   The  patient was advised to call back or seek an in-person evaluation if the symptoms worsen or if the condition fails to improve as anticipated.  I provided 15 minutes of non-face-to-face time during this encounter.      Review of Systems     Objective:   Physical Exam        Assessment & Plan:

## 2019-04-12 ENCOUNTER — Encounter: Payer: Self-pay | Admitting: Nurse Practitioner

## 2019-05-15 ENCOUNTER — Other Ambulatory Visit: Payer: Self-pay | Admitting: Nurse Practitioner

## 2019-06-05 ENCOUNTER — Encounter: Payer: Self-pay | Admitting: Nurse Practitioner

## 2019-06-05 ENCOUNTER — Telehealth: Payer: Self-pay | Admitting: Family Medicine

## 2019-06-05 NOTE — Telephone Encounter (Signed)
Answered through mychart

## 2019-06-05 NOTE — Telephone Encounter (Signed)
Pt rec'd recall letter, was confused, states Eber Jones told her at her last visit to follow up in 6 months  Everything in chart & appt checkout note states follow up in 3 months  Pt is scheduled for a phone visit with Eber Jones 07/03/2019 - does she need this visit?  Does she need labs?    Please advise & call pt

## 2019-06-12 ENCOUNTER — Other Ambulatory Visit: Payer: Self-pay | Admitting: Nurse Practitioner

## 2019-06-12 ENCOUNTER — Encounter: Payer: Self-pay | Admitting: Nurse Practitioner

## 2019-06-12 MED ORDER — CYCLOBENZAPRINE HCL 10 MG PO TABS: 10.0000 mg | ORAL_TABLET | Freq: Three times a day (TID) | ORAL | 0 refills | Status: DC | PRN

## 2019-07-03 ENCOUNTER — Other Ambulatory Visit: Payer: Self-pay

## 2019-07-03 ENCOUNTER — Encounter: Payer: Self-pay | Admitting: Nurse Practitioner

## 2019-07-03 ENCOUNTER — Telehealth: Payer: Self-pay | Admitting: *Deleted

## 2019-07-03 ENCOUNTER — Telehealth (INDEPENDENT_AMBULATORY_CARE_PROVIDER_SITE_OTHER): Payer: Medicare PPO | Admitting: Nurse Practitioner

## 2019-07-03 VITALS — Wt 237.8 lb

## 2019-07-03 DIAGNOSIS — E119 Type 2 diabetes mellitus without complications: Secondary | ICD-10-CM | POA: Diagnosis not present

## 2019-07-03 NOTE — Progress Notes (Signed)
  PHONE VISIT Subjective:    Patient ID: Ellen Ayers, female    DOB: 09/22/1955, 64 y.o.   MRN: [id number]  Diabetes She presents for her follow-up diabetic visit. She has type 2 diabetes mellitus. Risk factors for coronary artery disease include diabetes mellitus, dyslipidemia and hypertension. Current diabetic treatment includes diet. She is compliant with treatment all of the time.      Review of Systems Virtual Visit via Video Note  I connected with Ellen Ayers on 07/03/19 at 11:00 AM EDT by a video enabled telemedicine application and verified that I am speaking with the correct person using two identifiers.  Location: Patient: home Provider: office   I discussed the limitations of evaluation and management by telemedicine and the availability of in person appointments. The patient expressed understanding and agreed to proceed.  History of Present Illness: Still doing very well on her weight loss and activity. Reports her current weight as 237.8 lbs. Has weaned herself off Celexa. Has not used her inhaler. States breathing is much better after weight loss. No CP/ischemic type pain or SOB. No edema.    Observations/Objective: Today's visit was via telephone Physical exam was not possible for this visit Alert, oriented. Cheerful affect.   Assessment and Plan: Problem List Items Addressed This Visit      Endocrine   Diabetes mellitus type 2, uncomplicated (HCC) - Primary     Other   Morbid obesity (HCC)      Follow Up Instructions: Continue healthy lifestyle habits and current medication regimen. Return in about 3 months (around 10/03/2019). Plan office visit with foot exam at that time.     I discussed the assessment and treatment plan with the patient. The patient was provided an opportunity to ask questions and all were answered. The patient agreed with the plan and demonstrated an understanding of the instructions.   The patient was advised to call back or  seek an in-person evaluation if the symptoms worsen or if the condition fails to improve as anticipated.  I provided 15 minutes of non-face-to-face time during this encounter.        Objective:   Physical Exam        Assessment & Plan:

## 2019-07-03 NOTE — Telephone Encounter (Signed)
Ms. Ellen Ayers, Ellen Ayers are scheduled for a virtual visit with your provider today.    Just as we do with appointments in the office, we must obtain your consent to participate.  Your consent will be active for this visit and any virtual visit you may have with one of our providers in the next 365 days.    If you have a MyChart account, I can also send a copy of this consent to you electronically.  All virtual visits are billed to your insurance company just like a traditional visit in the office.  As this is a virtual visit, video technology does not allow for your provider to perform a traditional examination.  This may limit your provider's ability to fully assess your condition.  If your provider identifies any concerns that need to be evaluated in person or the need to arrange testing such as labs, EKG, etc, we will make arrangements to do so.    Although advances in technology are sophisticated, we cannot ensure that it will always work on either your end or our end.  If the connection with a video visit is poor, we may have to switch to a telephone visit.  With either a video or telephone visit, we are not always able to ensure that we have a secure connection.   I need to obtain your verbal consent now.   Are you willing to proceed with your visit today?   Ellen Ayers has provided verbal consent on 07/03/2019 for a virtual visit (video or telephone).   Kathleen Lime, RN 07/03/2019  11:17 AM

## 2019-08-19 ENCOUNTER — Other Ambulatory Visit: Payer: Self-pay | Admitting: Nurse Practitioner

## 2019-09-08 ENCOUNTER — Telehealth: Payer: Self-pay | Admitting: Nurse Practitioner

## 2019-09-08 DIAGNOSIS — E119 Type 2 diabetes mellitus without complications: Secondary | ICD-10-CM

## 2019-09-08 NOTE — Telephone Encounter (Signed)
Last labs 03/17/19 lipid, cmp, a1c, tsh

## 2019-09-08 NOTE — Telephone Encounter (Signed)
Patient would like labs done for her 3 month follow up with diabetes with Eber Jones 8/31

## 2019-09-11 NOTE — Telephone Encounter (Signed)
For this visit, just needs urine microalb ratio and A1C. We will do the complete panel later. Thanks.

## 2019-09-14 NOTE — Telephone Encounter (Signed)
Orders put in and pt was notified.  

## 2019-09-21 ENCOUNTER — Other Ambulatory Visit: Payer: Self-pay | Admitting: *Deleted

## 2019-09-21 MED ORDER — LISINOPRIL 5 MG PO TABS: 5.0000 mg | ORAL_TABLET | Freq: Every day | ORAL | 1 refills | Status: DC

## 2019-10-16 LAB — HEMOGLOBIN A1C
Est. average glucose Bld gHb Est-mCnc: 134 mg/dL
Hgb A1c MFr Bld: 6.3 % — ABNORMAL HIGH (ref 4.8–5.6)

## 2019-10-16 LAB — MICROALBUMIN / CREATININE URINE RATIO
Creatinine, Urine: 79.5 mg/dL
Microalb/Creat Ratio: 6 mg/g creat (ref 0–29)
Microalbumin, Urine: 4.4 ug/mL

## 2019-10-27 ENCOUNTER — Telehealth: Payer: Self-pay | Admitting: Family Medicine

## 2019-10-27 ENCOUNTER — Ambulatory Visit (INDEPENDENT_AMBULATORY_CARE_PROVIDER_SITE_OTHER): Payer: Medicare PPO | Admitting: Nurse Practitioner

## 2019-10-27 ENCOUNTER — Encounter: Payer: Self-pay | Admitting: Nurse Practitioner

## 2019-10-27 ENCOUNTER — Other Ambulatory Visit: Payer: Self-pay

## 2019-10-27 VITALS — BP 124/72 | HR 84 | Temp 96.5°F | Wt 233.6 lb

## 2019-10-27 DIAGNOSIS — I83893 Varicose veins of bilateral lower extremities with other complications: Secondary | ICD-10-CM | POA: Diagnosis not present

## 2019-10-27 DIAGNOSIS — Z23 Encounter for immunization: Secondary | ICD-10-CM | POA: Diagnosis not present

## 2019-10-27 DIAGNOSIS — E119 Type 2 diabetes mellitus without complications: Secondary | ICD-10-CM

## 2019-10-27 MED ORDER — TRULICITY 0.75 MG/0.5ML ~~LOC~~ SOAJ
0.7500 mg | SUBCUTANEOUS | 2 refills | Status: DC
Start: 2019-10-27 — End: 2019-10-30

## 2019-10-27 MED ORDER — CYCLOBENZAPRINE HCL 10 MG PO TABS: 10.0000 mg | ORAL_TABLET | Freq: Three times a day (TID) | ORAL | 0 refills | Status: DC | PRN

## 2019-10-27 NOTE — Progress Notes (Signed)
    Pt here for DM follow up. Pt not checking sugars regular; not having any issues. Pt would like refill on Flexeril. Taking all meds as prescribed.

## 2019-10-27 NOTE — Telephone Encounter (Signed)
Message for Ellen Ayers, Pt was asked to call back and say that any of the three medications would be fine which ever one Ellen Ayers would feel best.  Also Pt needs a refill on Medication for leg cramps she did not know the name of the medication.  Pt call back [phone]

## 2019-10-27 NOTE — Patient Instructions (Signed)
victoza Trulicity Ozempic

## 2019-10-27 NOTE — Progress Notes (Signed)
Subjective:    Patient ID: Ellen Ayers, female    DOB: 1955-11-14, 64 y.o.   MRN: [id number]  HPI Presents for recheck on her diabetes.  Overall has done very well except for a few occasions where she had increased sugar intake mainly when her grandchildren were visiting.  Limited activity due to mild left knee pain.  No chest pain/ischemic type pain or shortness of breath.  No edema.  Has tingling, cramping, itching discomfort in the lower legs and feet especially at nighttime.  Applies lotion to the area which helps. Denies any personal history of pancreatitis or cancer.  No family history of thyroid or other endocrine cancers. Depression screen [facility] 2/9 10/27/2019 05/23/2018 12/20/2017 03/24/2017  Decreased Interest 0 3 1 2   Down, Depressed, Hopeless 0 1 1 1   PHQ - 2 Score 0 4 2 3   Altered sleeping - 2 0 1  Tired, decreased energy - 2 1 -  Change in appetite - 3 0 3  Feeling bad or failure about yourself  - 0 1 1  Trouble concentrating - 0 1 0  Moving slowly or fidgety/restless - 0 0 0  Suicidal thoughts - 0 0 0  PHQ-9 Score - 11 5 8   Difficult doing work/chores - Not difficult at all Not difficult at all -     Review of Systems     Objective:   Physical Exam NAD.  Alert, oriented.  Lungs clear.  Heart regular rate rhythm.  Significant superficial and large varicose veins noted in both lower legs more on the left side.  A large bunion is noted at the base of the left great toe. Diabetic Foot Exam - Simple   Simple Foot Form Visual Inspection No deformities, no ulcerations, no other skin breakdown bilaterally: Yes Sensation Testing Intact to touch and monofilament testing bilaterally: Yes Pulse Check See comments: Yes Comments DP pulses present bilateral.  Toes cool with normal capillary refill.  Large bunion noted at the base of the left great toe with mild contracture of the next toe.    Results for orders placed or performed in visit on 09/08/19  Microalbumin /  creatinine urine ratio  Result Value Ref Range   Creatinine, Urine 79.5 Not Estab. mg/dL   Microalbumin, Urine 4.4 Not Estab. ug/mL   Microalb/Creat Ratio 6 0 - 29 mg/g creat  Hemoglobin A1c  Result Value Ref Range   Hgb A1c MFr Bld 6.3 (H) 4.8 - 5.6 %   Est. average glucose Bld gHb Est-mCnc 134 mg/dL      Assessment & Plan:   Problem List Items Addressed This Visit      Endocrine   Diabetes mellitus type 2, uncomplicated (HCC) - Primary   Relevant Medications   aspirin EC 81 MG tablet   Dulaglutide (TRULICITY) 0.75 MG/0.5ML SOPN    Other Visit Diagnoses    Need for vaccination       Relevant Orders   Pneumococcal conjugate vaccine 13-valent (Completed)   Varicose veins of bilateral lower extremities with other complications       Relevant Medications   aspirin EC 81 MG tablet      Meds ordered this encounter  Medications  . cyclobenzaprine (FLEXERIL) 10 MG tablet    Sig: Take 1 tablet (10 mg total) by mouth 3 (three) times daily as needed for muscle spasms.    Dispense:  30 tablet    Refill:  0    Order Specific Question:   Supervising Provider  Answer:   Lilyan Punt A [[id number]]  . Dulaglutide (TRULICITY) 0.75 MG/0.5ML SOPN    Sig: Inject 0.5 mLs (0.75 mg total) into the skin once a week.    Dispense:  2 mL    Refill:  2    Order Specific Question:   Supervising Provider    Answer:   Lilyan Punt A [[id number]]   Cautioned about drowsiness associated with Flexeril use.  Use sparingly for severe leg cramps.  Patient defers referral to vein clinic at this time. Has hit a plateau with her weight, interested in trying a different medication to help with her diabetes and weight. Trial of Trulicity once a week as directed.  Reviewed potential adverse effects.  DC medication and contact office if any problems. Recommend activity as tolerated.  Defers referral to podiatry for left foot issues. Recommend preventive health physical.  Defers Covid vaccine.  Recommend flu vaccine  this fall. Return in about 3 months (around 01/26/2020).

## 2019-10-28 ENCOUNTER — Encounter: Payer: Self-pay | Admitting: Nurse Practitioner

## 2019-10-29 ENCOUNTER — Telehealth: Payer: Self-pay | Admitting: Family Medicine

## 2019-10-29 MED ORDER — LEVOTHYROXINE SODIUM 100 MCG PO TABS: 100.0000 ug | ORAL_TABLET | Freq: Every day | ORAL | 1 refills | Status: DC

## 2019-10-29 NOTE — Telephone Encounter (Signed)
[facility] [place] requesting refill on levothyroxine 100 mcg tab. Pt seen 10/27/19 with Eber Jones for DM. Please advise. Thank you

## 2019-10-29 NOTE — Addendum Note (Signed)
Addended by: Annalee Genta on: 10/29/2019 12:10 PM   Modules accepted: Orders

## 2019-10-30 ENCOUNTER — Other Ambulatory Visit: Payer: Self-pay | Admitting: Nurse Practitioner

## 2019-10-30 MED ORDER — TRULICITY 0.75 MG/0.5ML ~~LOC~~ SOAJ: 0.7500 mg | SUBCUTANEOUS | 0 refills | Status: DC

## 2019-11-17 ENCOUNTER — Encounter: Payer: Self-pay | Admitting: Nurse Practitioner

## 2019-11-17 ENCOUNTER — Other Ambulatory Visit: Payer: Self-pay | Admitting: Nurse Practitioner

## 2019-11-17 MED ORDER — TRULICITY 0.75 MG/0.5ML ~~LOC~~ SOAJ: 0.7500 mg | SUBCUTANEOUS | 0 refills | Status: DC

## 2019-11-23 ENCOUNTER — Other Ambulatory Visit: Payer: Self-pay | Admitting: Family Medicine

## 2020-02-07 ENCOUNTER — Other Ambulatory Visit: Payer: Self-pay | Admitting: Nurse Practitioner

## 2020-04-09 ENCOUNTER — Telehealth: Payer: Self-pay | Admitting: Family Medicine

## 2020-04-09 ENCOUNTER — Other Ambulatory Visit: Payer: Self-pay | Admitting: Nurse Practitioner

## 2020-04-11 ENCOUNTER — Other Ambulatory Visit: Payer: Self-pay | Admitting: Nurse Practitioner

## 2020-04-12 NOTE — Telephone Encounter (Signed)
Needs to establish care with me and needs thyroid labs for refills on Euthyrox. Thx. Dr. Ladona Ridgel

## 2020-04-12 NOTE — Telephone Encounter (Signed)
Please contact patient to have her set up appt with Dr.Taylor. thank you! 

## 2020-04-12 NOTE — Telephone Encounter (Signed)
Sent my chart message to schedule appointment.

## 2020-04-13 ENCOUNTER — Telehealth: Payer: Self-pay | Admitting: Nurse Practitioner

## 2020-04-13 ENCOUNTER — Other Ambulatory Visit: Payer: Self-pay | Admitting: *Deleted

## 2020-04-13 DIAGNOSIS — E039 Hypothyroidism, unspecified: Secondary | ICD-10-CM

## 2020-04-13 DIAGNOSIS — E785 Hyperlipidemia, unspecified: Secondary | ICD-10-CM

## 2020-04-13 DIAGNOSIS — E119 Type 2 diabetes mellitus without complications: Secondary | ICD-10-CM

## 2020-04-13 DIAGNOSIS — Z79899 Other long term (current) drug therapy: Secondary | ICD-10-CM

## 2020-04-13 MED ORDER — CYCLOBENZAPRINE HCL 10 MG PO TABS: 10.0000 mg | ORAL_TABLET | Freq: Three times a day (TID) | ORAL | 0 refills | Status: DC | PRN

## 2020-04-13 MED ORDER — PRAVASTATIN SODIUM 40 MG PO TABS
40.0000 mg | ORAL_TABLET | Freq: Every day | ORAL | 0 refills | Status: DC
Start: 2020-04-13 — End: 2020-04-22

## 2020-04-13 MED ORDER — LISINOPRIL 5 MG PO TABS
5.0000 mg | ORAL_TABLET | Freq: Every day | ORAL | 0 refills | Status: DC
Start: 2020-04-13 — End: 2020-04-22

## 2020-04-13 NOTE — Telephone Encounter (Signed)
Will patient need labs done for diabetes follow up and med check on 2/25. She also wanting to know if this can be a phone visit. Please advise

## 2020-04-13 NOTE — Telephone Encounter (Signed)
Pt.notified

## 2020-04-13 NOTE — Telephone Encounter (Signed)
A1C, Urine ACR 10/15/19 last labs completed. Please advise. Thank you

## 2020-04-13 NOTE — Telephone Encounter (Signed)
Pls give 30 day supply of medications, no refill.  Has appt coming up with carolyn.  She can get refills with that appt. If hasn't been in and needing a follow up on htn, then needs to be in person visit to evaluate the bp.  Pt also hasn't had labs in over 1 yr, pls order cbc, cmp, lipids and a1c to be done prior to the visit.   Thx.   Dr. Ladona Ridgel

## 2020-04-13 NOTE — Telephone Encounter (Signed)
Patient has appointment 2/25 for med check/diabetes . She is want her medication called in ,she states she is out

## 2020-04-13 NOTE — Telephone Encounter (Signed)
Orders put in and refills sent. Left message to return call

## 2020-04-13 NOTE — Telephone Encounter (Signed)
Last labs lipid, tsh, cmp on 03/17/19 and a1c and urine acr on 10/15/19. Pt also asked for refill on lisinopril, cholesterol med and cyclobenazprine. States she takes that for cramps in her legs. Also wanted to ask if her appt could  be virtual. She goes to [facility] to see her mom twice a week and trying not to catch covid.

## 2020-04-13 NOTE — Telephone Encounter (Signed)
I see where someone has already ordered her labs. Virtual visit is fine since she has had an in person visit in August and we did her foot exam then. Thanks.

## 2020-04-13 NOTE — Telephone Encounter (Signed)
Pt contacted and verbalized understanding.  

## 2020-04-18 DIAGNOSIS — E119 Type 2 diabetes mellitus without complications: Secondary | ICD-10-CM | POA: Diagnosis not present

## 2020-04-18 DIAGNOSIS — E039 Hypothyroidism, unspecified: Secondary | ICD-10-CM | POA: Diagnosis not present

## 2020-04-18 DIAGNOSIS — Z79899 Other long term (current) drug therapy: Secondary | ICD-10-CM | POA: Diagnosis not present

## 2020-04-18 DIAGNOSIS — E785 Hyperlipidemia, unspecified: Secondary | ICD-10-CM | POA: Diagnosis not present

## 2020-04-19 LAB — COMPREHENSIVE METABOLIC PANEL
ALT: 17 IU/L (ref 0–32)
AST: 21 IU/L (ref 0–40)
Albumin/Globulin Ratio: 1.3 (ref 1.2–2.2)
Albumin: 3.8 g/dL (ref 3.8–4.8)
Alkaline Phosphatase: 113 IU/L (ref 44–121)
BUN/Creatinine Ratio: 26 (ref 12–28)
BUN: 19 mg/dL (ref 8–27)
Bilirubin Total: 0.6 mg/dL (ref 0.0–1.2)
CO2: 23 mmol/L (ref 20–29)
Calcium: 9.5 mg/dL (ref 8.7–10.3)
Chloride: 103 mmol/L (ref 96–106)
Creatinine, Ser: 0.73 mg/dL (ref 0.57–1.00)
GFR calc Af Amer: 99 mL/min/{1.73_m2} (ref >59)
GFR calc non Af Amer: 86 mL/min/{1.73_m2} (ref >59)
Globulin, Total: 2.9 g/dL (ref 1.5–4.5)
Glucose: 129 mg/dL — ABNORMAL HIGH (ref 65–99)
Potassium: 5.1 mmol/L (ref 3.5–5.2)
Sodium: 140 mmol/L (ref 134–144)
Total Protein: 6.7 g/dL (ref 6.0–8.5)

## 2020-04-19 LAB — HEMOGLOBIN A1C
Est. average glucose Bld gHb Est-mCnc: 134 mg/dL
Hgb A1c MFr Bld: 6.3 % — ABNORMAL HIGH (ref 4.8–5.6)

## 2020-04-19 LAB — CBC WITH DIFFERENTIAL/PLATELET
Basophils Absolute: 0.1 10*3/uL (ref 0.0–0.2)
Basos: 1 %
EOS (ABSOLUTE): 0.2 10*3/uL (ref 0.0–0.4)
Eos: 2 %
Hematocrit: 41.2 % (ref 34.0–46.6)
Hemoglobin: 13.8 g/dL (ref 11.1–15.9)
Immature Grans (Abs): 0 10*3/uL (ref 0.0–0.1)
Immature Granulocytes: 0 %
Lymphocytes Absolute: 3.6 10*3/uL — ABNORMAL HIGH (ref 0.7–3.1)
Lymphs: 35 %
MCH: 32.4 pg (ref 26.6–33.0)
MCHC: 33.5 g/dL (ref 31.5–35.7)
MCV: 97 fL (ref 79–97)
Monocytes Absolute: 0.9 10*3/uL (ref 0.1–0.9)
Monocytes: 8 %
Neutrophils Absolute: 5.5 10*3/uL (ref 1.4–7.0)
Neutrophils: 54 %
Platelets: 286 10*3/uL (ref 150–450)
RBC: 4.26 x10E6/uL (ref 3.77–5.28)
RDW: 11.8 % (ref 11.7–15.4)
WBC: 10.3 10*3/uL (ref 3.4–10.8)

## 2020-04-19 LAB — LIPID PANEL
Chol/HDL Ratio: 3.3 ratio (ref 0.0–4.4)
Cholesterol, Total: 176 mg/dL (ref 100–199)
HDL: 53 mg/dL (ref >39)
LDL Chol Calc (NIH): 102 mg/dL — ABNORMAL HIGH (ref 0–99)
Triglycerides: 117 mg/dL (ref 0–149)
VLDL Cholesterol Cal: 21 mg/dL (ref 5–40)

## 2020-04-19 LAB — TSH: TSH: 0.403 u[IU]/mL — ABNORMAL LOW (ref 0.450–4.500)

## 2020-04-22 ENCOUNTER — Other Ambulatory Visit: Payer: Self-pay

## 2020-04-22 ENCOUNTER — Telehealth: Payer: Self-pay | Admitting: *Deleted

## 2020-04-22 ENCOUNTER — Encounter: Payer: Self-pay | Admitting: Nurse Practitioner

## 2020-04-22 ENCOUNTER — Telehealth (INDEPENDENT_AMBULATORY_CARE_PROVIDER_SITE_OTHER): Payer: Medicare PPO | Admitting: Nurse Practitioner

## 2020-04-22 VITALS — Wt 242.0 lb

## 2020-04-22 DIAGNOSIS — E119 Type 2 diabetes mellitus without complications: Secondary | ICD-10-CM

## 2020-04-22 DIAGNOSIS — E039 Hypothyroidism, unspecified: Secondary | ICD-10-CM | POA: Diagnosis not present

## 2020-04-22 DIAGNOSIS — E785 Hyperlipidemia, unspecified: Secondary | ICD-10-CM | POA: Diagnosis not present

## 2020-04-22 MED ORDER — LEVOTHYROXINE SODIUM 100 MCG PO TABS: ORAL_TABLET | ORAL | 1 refills | Status: DC

## 2020-04-22 MED ORDER — LISINOPRIL 5 MG PO TABS: 5.0000 mg | ORAL_TABLET | Freq: Every day | ORAL | 1 refills | Status: DC

## 2020-04-22 MED ORDER — PHENTERMINE HCL 37.5 MG PO TABS: 37.5000 mg | ORAL_TABLET | Freq: Every day | ORAL | 0 refills | Status: DC

## 2020-04-22 MED ORDER — PRAVASTATIN SODIUM 40 MG PO TABS: 40.0000 mg | ORAL_TABLET | Freq: Every day | ORAL | 1 refills | Status: DC

## 2020-04-22 NOTE — Progress Notes (Signed)
   Subjective:    Patient ID: Ellen Ayers, female    DOB: [birth year], 65 y.o.   MRN: [id number]  Diabetes She presents for her follow-up diabetic visit. She has type 2 diabetes mellitus. Risk factors for coronary artery disease include diabetes mellitus, hypertension and dyslipidemia. Current diabetic treatment includes diet.   Virtual Visit via Telephone Note  I connected with [name] on 04/22/20 at  9:20 AM EST by telephone and verified that I am speaking with the correct person using two identifiers.  Location: Patient: home Provider: office   I discussed the limitations, risks, security and privacy concerns of performing an evaluation and management service by telephone and the availability of in person appointments. I also discussed with the patient that there may be a patient responsible charge related to this service. The patient expressed understanding and agreed to proceed.   History of Present Illness:    Observations/Objective:   Assessment and Plan:   Follow Up Instructions:    I discussed the assessment and treatment plan with the patient. The patient was provided an opportunity to ask questions and all were answered. The patient agreed with the plan and demonstrated an understanding of the instructions.   The patient was advised to call back or seek an in-person evaluation if the symptoms worsen or if the condition fails to improve as anticipated.          Review of Systems     Objective:   Physical Exam        Assessment & Plan:

## 2020-04-22 NOTE — Progress Notes (Signed)
Subjective:    Patient ID: Ellen Ayers, female    DOB: 08-16-1955, 65 y.o.   MRN: [id number]  HPI  I connected with  Ellen Ayers on 04/22/20 by a video enabled telemedicine application and verified that I am speaking with the correct person using two identifiers.   I discussed the limitations of evaluation and management by telemedicine. The patient expressed understanding and agreed to proceed.  Connected with Ellen Ayers today via a phone call (unable to perform video visit) to follow-up on diabetes and labs.  Ellen Ayers feels that she is struggling due to gaining 10 lbs and feels unable to lose it.  She at one point weighed 300 lbs but feels as though she has hit a plateau with weight loss.  She generally eats well but feels if she consumes anything with sugar that will trigger cravings and this throws her diet off and she cannot recover.  She has cut out all drinks and is trying to only drink water.  She does not feel that the Trulicity at current dose has been helping at all with weight loss. Reports checking her feet as she moisturizes them daily, no rashes, wounds, or lesions.  She will get her annual foot exam when she comes in for her physical in August.  She knows she is also due for an eye exam and will make her appointment for that.     She has been taking her Euthyrox as soon as she wakes up about an hour before breakfast without any other medicine.  She recalls having to take half a tablet instead of a whole on Saturdays at one point.   Pt reports that she felt the pravastatin was causing some muscle aches in her arms so she has only been taking one whole tablet (21m) every other day, and half of a tablet (20 mg) on the other days.  She does report having a little bit of trouble sleeping, 2-3 nights a week, typically when she has something to do the next morning.  She has not tried anything to help her stay asleep.     Review of Systems  Constitutional: Negative for appetite  change.  Eyes: Negative for visual disturbance.  Respiratory: Negative for cough and shortness of breath.   Cardiovascular: Negative for chest pain, palpitations and leg swelling.  Gastrointestinal: Negative for abdominal pain, constipation and diarrhea.  Genitourinary: Negative for difficulty urinating.  Neurological: Negative for dizziness, tremors, weakness, numbness and headaches.  Psychiatric/Behavioral: Positive for sleep disturbance.       Objective:   Physical Exam Neurological:     Mental Status: She is alert and oriented to person, place, and time.  Psychiatric:        Mood and Affect: Mood normal.        Thought Content: Thought content normal.        Judgment: Judgment normal.   Physical exam limited due to exam performed via phone.    Recent Results (from the past 2160 hour(s))  CBC with Differential/Platelet     Status: Abnormal   Collection Time: 04/18/20  9:36 AM  Result Value Ref Range   WBC 10.3 3.4 - 10.8 x10E3/uL   RBC 4.26 3.77 - 5.28 x10E6/uL   Hemoglobin 13.8 11.1 - 15.9 g/dL   Hematocrit 41.2 34.0 - 46.6 %   MCV 97 79 - 97 fL   MCH 32.4 26.6 - 33.0 pg   MCHC 33.5 31.5 - 35.7 g/dL   RDW  11.8 11.7 - 15.4 %   Platelets 286 150 - 450 x10E3/uL   Neutrophils 54 Not Estab. %   Lymphs 35 Not Estab. %   Monocytes 8 Not Estab. %   Eos 2 Not Estab. %   Basos 1 Not Estab. %   Neutrophils Absolute 5.5 1.4 - 7.0 x10E3/uL   Lymphocytes Absolute 3.6 (H) 0.7 - 3.1 x10E3/uL   Monocytes Absolute 0.9 0.1 - 0.9 x10E3/uL   EOS (ABSOLUTE) 0.2 0.0 - 0.4 x10E3/uL   Basophils Absolute 0.1 0.0 - 0.2 x10E3/uL   Immature Granulocytes 0 Not Estab. %   Immature Grans (Abs) 0.0 0.0 - 0.1 x10E3/uL  Comprehensive metabolic panel     Status: Abnormal   Collection Time: 04/18/20  9:36 AM  Result Value Ref Range   Glucose 129 (H) 65 - 99 mg/dL   BUN 19 8 - 27 mg/dL   Creatinine, Ser 0.73 0.57 - 1.00 mg/dL    Comment:                **Effective April 25, 2020 [facility] will  begin**                  reporting the 2021 CKD-EPI creatinine equation that                  estimates kidney function without a race variable.    GFR calc non Af Amer 86 >59 mL/min/1.73   GFR calc Af Amer 99 >59 mL/min/1.73    Comment: **In accordance with recommendations from the NKF-ASN Task force,**   [facility] is in the process of updating its eGFR calculation to the   2021 CKD-EPI creatinine equation that estimates kidney function   without a race variable.    BUN/Creatinine Ratio 26 12 - 28   Sodium 140 134 - 144 mmol/L   Potassium 5.1 3.5 - 5.2 mmol/L   Chloride 103 96 - 106 mmol/L   CO2 23 20 - 29 mmol/L   Calcium 9.5 8.7 - 10.3 mg/dL   Total Protein 6.7 6.0 - 8.5 g/dL   Albumin 3.8 3.8 - 4.8 g/dL   Globulin, Total 2.9 1.5 - 4.5 g/dL   Albumin/Globulin Ratio 1.3 1.2 - 2.2   Bilirubin Total 0.6 0.0 - 1.2 mg/dL   Alkaline Phosphatase 113 44 - 121 IU/L   AST 21 0 - 40 IU/L   ALT 17 0 - 32 IU/L  Lipid panel     Status: Abnormal   Collection Time: 04/18/20  9:36 AM  Result Value Ref Range   Cholesterol, Total 176 100 - 199 mg/dL   Triglycerides 117 0 - 149 mg/dL   HDL 53 >39 mg/dL   VLDL Cholesterol Cal 21 5 - 40 mg/dL   LDL Chol Calc (NIH) 102 (H) 0 - 99 mg/dL   Chol/HDL Ratio 3.3 0.0 - 4.4 ratio    Comment:                                   T. Chol/HDL Ratio                                             Men  Women  1/2 Avg.Risk  3.4    3.3                                   Avg.Risk  5.0    4.4                                2X Avg.Risk  9.6    7.1                                3X Avg.Risk 23.4   11.0   Hemoglobin A1c     Status: Abnormal   Collection Time: 04/18/20  9:36 AM  Result Value Ref Range   Hgb A1c MFr Bld 6.3 (H) 4.8 - 5.6 %    Comment:          Prediabetes: 5.7 - 6.4          Diabetes: >6.4          Glycemic control for adults with diabetes: <7.0    Est. average glucose Bld gHb Est-mCnc 134 mg/dL  TSH     Status: Abnormal    Collection Time: 04/18/20  9:36 AM  Result Value Ref Range   TSH 0.403 (L) 0.450 - 4.500 uIU/mL         Assessment & Plan:   Problem List Items Addressed This Visit      Endocrine   Diabetes mellitus type 2, uncomplicated (HCC) - Primary   Relevant Medications   pravastatin (PRAVACHOL) 40 MG tablet   lisinopril (ZESTRIL) 5 MG tablet   Hypothyroidism   Relevant Medications   levothyroxine (EUTHYROX) 100 MCG tablet   Other Relevant Orders   TSH     Other   Hyperlipidemia   Relevant Medications   pravastatin (PRAVACHOL) 40 MG tablet   lisinopril (ZESTRIL) 5 MG tablet   Morbid obesity (HCC)   Relevant Medications   phentermine (ADIPEX-P) 37.5 MG tablet      Meds ordered this encounter  Medications  . pravastatin (PRAVACHOL) 40 MG tablet    Sig: Take 1 tablet (40 mg total) by mouth daily.    Dispense:  90 tablet    Refill:  1    Order Specific Question:   Supervising Provider    Answer:   Sallee Lange A [[id number]]  . lisinopril (ZESTRIL) 5 MG tablet    Sig: Take 1 tablet (5 mg total) by mouth daily.    Dispense:  90 tablet    Refill:  1    Order Specific Question:   Supervising Provider    Answer:   Sallee Lange A [[id number]]  . levothyroxine (EUTHYROX) 100 MCG tablet    Sig: Take 1 tablet (100 mcg total) by mouth daily M-F and 1/2 tab (50 mcg) by mouth on Saturday and Sunday.    Dispense:  72 tablet    Refill:  1    Order Specific Question:   Supervising Provider    Answer:   Sallee Lange A [[id number]]  . phentermine (ADIPEX-P) 37.5 MG tablet    Sig: Take 1 tablet (37.5 mg total) by mouth daily before breakfast.    Dispense:  30 tablet    Refill:  0    Order Specific Question:   Supervising Provider    Answer:   Sallee Lange A [[id number]]  Labs reviewed with patient.    TSH is 0.403. She will take a whole tablet M-F and a half a tablet of the Euthyrox on Saturday and Sunday and will recheck TSH in 3 months.    LDL is up today to 102.  Discussed with patient,  explained about elevated cholesterol and increase risk of cardiovascular events in addition to being diabetic. Offered switching medication to rosuvastatin.  She just got pravastatin refilled, and in agreement to take whole tablet daily.  She will call if muscle aches worsen or she is unable to tolerate.   She will try melatonin, regular or extended release, for sleep, and contact us if needed.    She would like to stop the trulicity and go back on phentermine, which she felt helped her and she liked in the past.  She does not take it daily, but feels that it may help her kick start a little more weight loss.  She will make sure to take it in the morning so that it does not interfere with her sleep.    Discussed with patient if she is to develop symptoms such as but not limited to tremors, palpitations, increased trouble sleeping, to contact the office.  Patient verbalized understanding.   Return in about 6 months (around 10/20/2020) for Diabetes checkup.  I provided 15 minutes of non-face-to-face time during this encounter.

## 2020-04-22 NOTE — Telephone Encounter (Signed)
[name], giebler are scheduled for a virtual visit with your provider today.    Just as we do with appointments in the office, we must obtain your consent to participate.  Your consent will be active for this visit and any virtual visit you may have with one of our providers in the next 365 days.    If you have a MyChart account, I can also send a copy of this consent to you electronically.  All virtual visits are billed to your insurance company just like a traditional visit in the office.  As this is a virtual visit, video technology does not allow for your provider to perform a traditional examination.  This may limit your provider's ability to fully assess your condition.  If your provider identifies any concerns that need to be evaluated in person or the need to arrange testing such as labs, EKG, etc, we will make arrangements to do so.    Although advances in technology are sophisticated, we cannot ensure that it will always work on either your end or our end.  If the connection with a video visit is poor, we may have to switch to a telephone visit.  With either a video or telephone visit, we are not always able to ensure that we have a secure connection.   I need to obtain your verbal consent now.   Are you willing to proceed with your visit today?   Ellen Ayers has provided verbal consent on 04/22/2020 for a virtual visit (video or telephone).

## 2020-04-23 ENCOUNTER — Encounter: Payer: Self-pay | Admitting: Nurse Practitioner

## 2020-05-10 ENCOUNTER — Other Ambulatory Visit: Payer: Self-pay | Admitting: Family Medicine

## 2020-07-27 DIAGNOSIS — E039 Hypothyroidism, unspecified: Secondary | ICD-10-CM | POA: Diagnosis not present

## 2020-07-28 LAB — TSH: TSH: 5.52 u[IU]/mL — ABNORMAL HIGH (ref 0.450–4.500)

## 2020-07-29 ENCOUNTER — Other Ambulatory Visit: Payer: Self-pay | Admitting: Nurse Practitioner

## 2020-07-29 ENCOUNTER — Encounter: Payer: Self-pay | Admitting: Nurse Practitioner

## 2020-07-29 DIAGNOSIS — E039 Hypothyroidism, unspecified: Secondary | ICD-10-CM

## 2020-07-29 DIAGNOSIS — E119 Type 2 diabetes mellitus without complications: Secondary | ICD-10-CM

## 2020-07-29 DIAGNOSIS — E785 Hyperlipidemia, unspecified: Secondary | ICD-10-CM

## 2020-07-29 MED ORDER — LEVOTHYROXINE SODIUM 100 MCG PO TABS: ORAL_TABLET | ORAL | 1 refills | Status: DC

## 2020-07-29 MED ORDER — LISINOPRIL 5 MG PO TABS: 5.0000 mg | ORAL_TABLET | Freq: Every day | ORAL | 0 refills | Status: DC

## 2020-07-29 MED ORDER — PRAVASTATIN SODIUM 40 MG PO TABS: 40.0000 mg | ORAL_TABLET | Freq: Every day | ORAL | 0 refills | Status: DC

## 2020-07-29 NOTE — Progress Notes (Signed)
Lab orders placed.  

## 2020-07-29 NOTE — Addendum Note (Signed)
Addended by: Marlowe Shores on: 07/29/2020 02:15 PM   Modules accepted: Orders

## 2020-12-19 ENCOUNTER — Encounter: Payer: Self-pay | Admitting: Family Medicine

## 2020-12-29 DIAGNOSIS — E785 Hyperlipidemia, unspecified: Secondary | ICD-10-CM | POA: Diagnosis not present

## 2020-12-29 DIAGNOSIS — E119 Type 2 diabetes mellitus without complications: Secondary | ICD-10-CM | POA: Diagnosis not present

## 2020-12-29 DIAGNOSIS — E039 Hypothyroidism, unspecified: Secondary | ICD-10-CM | POA: Diagnosis not present

## 2020-12-30 LAB — HEMOGLOBIN A1C
Est. average glucose Bld gHb Est-mCnc: 146 mg/dL
Hgb A1c MFr Bld: 6.7 % — ABNORMAL HIGH (ref 4.8–5.6)

## 2020-12-30 LAB — LIPID PANEL
Chol/HDL Ratio: 4.2 ratio (ref 0.0–4.4)
Cholesterol, Total: 191 mg/dL (ref 100–199)
HDL: 46 mg/dL (ref >39)
LDL Chol Calc (NIH): 112 mg/dL — ABNORMAL HIGH (ref 0–99)
Triglycerides: 187 mg/dL — ABNORMAL HIGH (ref 0–149)
VLDL Cholesterol Cal: 33 mg/dL (ref 5–40)

## 2020-12-30 LAB — MICROALBUMIN / CREATININE URINE RATIO
Creatinine, Urine: 117.8 mg/dL
Microalb/Creat Ratio: 6 mg/g creat (ref 0–29)
Microalbumin, Urine: 7.3 ug/mL

## 2020-12-30 LAB — TSH: TSH: 7.38 u[IU]/mL — ABNORMAL HIGH (ref 0.450–4.500)

## 2020-12-31 ENCOUNTER — Other Ambulatory Visit: Payer: Self-pay | Admitting: Family Medicine

## 2021-01-01 ENCOUNTER — Encounter: Payer: Self-pay | Admitting: Nurse Practitioner

## 2021-01-02 ENCOUNTER — Telehealth: Payer: Self-pay | Admitting: Family Medicine

## 2021-01-02 NOTE — Telephone Encounter (Signed)
Tommie Sams, DO  Marlowe Shores, LPN Please clarify how she is taking her thyroid medication.   Thank you   Dr. Adriana Simas  My chart message sent to patient

## 2021-01-02 NOTE — Telephone Encounter (Signed)
Pt replied via MyChart: I take 1 pill each morning during the week (in combination with vitamins) then 1/2 tablet on Saturday & Sunday.  Then nothing to eat until at least 1 hour afterwards.  Only with water.  Please advise. Thank you!!

## 2021-01-02 NOTE — Telephone Encounter (Signed)
Mychart message sent to patient.

## 2021-01-02 NOTE — Telephone Encounter (Signed)
She should not take the synthroid with her vitamins.  Take 1 tablet daily. No more half tablets. Labs in 6 weeks.

## 2021-01-04 ENCOUNTER — Other Ambulatory Visit: Payer: Self-pay | Admitting: Family Medicine

## 2021-01-04 MED ORDER — PRAVASTATIN SODIUM 80 MG PO TABS: 80.0000 mg | ORAL_TABLET | Freq: Every day | ORAL | 1 refills | Status: DC

## 2021-01-06 ENCOUNTER — Other Ambulatory Visit: Payer: Self-pay | Admitting: Nurse Practitioner

## 2021-01-06 MED ORDER — CITALOPRAM HYDROBROMIDE 20 MG PO TABS: 20.0000 mg | ORAL_TABLET | Freq: Every day | ORAL | 0 refills | Status: DC

## 2021-01-11 ENCOUNTER — Ambulatory Visit: Payer: Medicare PPO | Admitting: Family Medicine

## 2021-01-11 ENCOUNTER — Encounter: Payer: Self-pay | Admitting: Family Medicine

## 2021-01-11 ENCOUNTER — Other Ambulatory Visit: Payer: Self-pay

## 2021-01-11 VITALS — BP 138/84 | HR 76 | Temp 97.2°F | Ht 60.0 in | Wt 260.6 lb

## 2021-01-11 DIAGNOSIS — J019 Acute sinusitis, unspecified: Secondary | ICD-10-CM | POA: Insufficient documentation

## 2021-01-11 DIAGNOSIS — E039 Hypothyroidism, unspecified: Secondary | ICD-10-CM

## 2021-01-11 DIAGNOSIS — E785 Hyperlipidemia, unspecified: Secondary | ICD-10-CM | POA: Diagnosis not present

## 2021-01-11 DIAGNOSIS — E119 Type 2 diabetes mellitus without complications: Secondary | ICD-10-CM | POA: Diagnosis not present

## 2021-01-11 DIAGNOSIS — M19042 Primary osteoarthritis, left hand: Secondary | ICD-10-CM

## 2021-01-11 DIAGNOSIS — M19041 Primary osteoarthritis, right hand: Secondary | ICD-10-CM | POA: Diagnosis not present

## 2021-01-11 DIAGNOSIS — I10 Essential (primary) hypertension: Secondary | ICD-10-CM

## 2021-01-11 HISTORY — DX: Acute sinusitis, unspecified: J01.90

## 2021-01-11 MED ORDER — MELOXICAM 7.5 MG PO TABS: 7.5000 mg | ORAL_TABLET | Freq: Every day | ORAL | 1 refills | Status: DC | PRN

## 2021-01-11 MED ORDER — AMOXICILLIN-POT CLAVULANATE 875-125 MG PO TABS: 1.0000 | ORAL_TABLET | Freq: Two times a day (BID) | ORAL | 0 refills | Status: DC

## 2021-01-11 MED ORDER — HYDROCODONE BIT-HOMATROP MBR 5-1.5 MG/5ML PO SOLN: 5.0000 mL | Freq: Three times a day (TID) | ORAL | 0 refills | Status: DC | PRN

## 2021-01-11 NOTE — Assessment & Plan Note (Signed)
Treating with Augmentin. Hycodan for cough.

## 2021-01-11 NOTE — Assessment & Plan Note (Signed)
We will plan to recheck in the near future.  Patient tolerating increased dose of pravastatin.

## 2021-01-11 NOTE — Assessment & Plan Note (Signed)
Recent dose change.  Labs on/after 12/15.

## 2021-01-11 NOTE — Assessment & Plan Note (Signed)
Stable/at goal.  We will continue to monitor.

## 2021-01-11 NOTE — Patient Instructions (Addendum)
Medications as prescribed.  Lab on or after 02/09/21.  Follow up in 6 months  Take care  Dr. Adriana Simas

## 2021-01-11 NOTE — Assessment & Plan Note (Signed)
Stable. Continue lisinopril

## 2021-01-11 NOTE — Assessment & Plan Note (Signed)
Starting Meloxicam. Advised to use PRN.

## 2021-01-11 NOTE — Progress Notes (Signed)
Subjective:  Patient ID: Ellen Ayers, female    DOB: 1956-02-05  Age: 65 y.o. MRN: [id number]  CC: Chief Complaint  Patient presents with   Diabetes    Also here to establish care. Does not test sugars. Wants an antibiotic for her crud; cough and sinus drainage. Refill on cough med    HPI:  65 year old female with hypothyroidism, hypertension, hyperlipidemia, type 2 diabetes, morbid obesity presents for follow-up.  Arthritis Patient states that she is experiencing arthritis particularly of the [facility] joints bilaterally.  She states that she is using topical therapy but is still having difficulty.  It is now interfering with her normal activities.  She would like to discuss treatment options today.  Sinusitis Patient states that she has been sick for the past week.  She states that she was recently blowing leaves and then subsequently developed sinus pain, pressure, and cough.  She states that her sputum and nasal discharge is discolored.  She has been using some leftover cough medication with improvement in the cough but no resolution in her symptoms otherwise.  She is requesting a refill on Hycodan.  Patient also concerned that she may need antibiotic therapy. No fever.  DM-2 Recent A1c of 6.7 reviewed with the patient.  She is not on any pharmacotherapy for diabetes at this time.  She wants to continue diet/lifestyle changes.  Hypertension BPs have been stable.  She is currently on lisinopril 5 mg daily and doing well.  Hyperlipidemia Uncontrolled.  Patient's labs were recently reviewed and her pravastatin was increased.  She states that she is tolerating this well.  Hypothyroidism TSH recently elevated.  Synthroid was adjusted.   Patient states that she was previously taking her Synthroid with vitamins.  She is no longer doing this and is taking it on an empty stomach with water.  Patient Active Problem List   Diagnosis Date Noted   Primary osteoarthritis of both hands  01/11/2021   Acute sinusitis 01/11/2021   Anxiety 07/09/2014   Vitamin D deficiency 07/09/2014   Diabetes mellitus type 2, uncomplicated (HCC) 07/06/2014   Morbid obesity (HCC) 12/06/2013   Essential hypertension 10/06/2013   Hypothyroidism 06/09/2012   Hyperlipidemia 06/09/2012    Social Hx   Social History   Socioeconomic History   Marital status: Widowed    Spouse name: Not on file   Number of children: Not on file   Years of education: Not on file   Highest education level: Not on file  Occupational History   Not on file  Tobacco Use   Smoking status: Never   Smokeless tobacco: Never  Substance and Sexual Activity   Alcohol use: No    Alcohol/week: 0.0 standard drinks   Drug use: No   Sexual activity: Not on file  Other Topics Concern   Not on file  Social History Narrative   Not on file   Social Determinants of Health   Financial Resource Strain: Not on file  Food Insecurity: Not on file  Transportation Needs: Not on file  Physical Activity: Not on file  Stress: Not on file  Social Connections: Not on file    Review of Systems Per HPI  Objective:  BP 138/84   Pulse 76   Temp (!) 97.2 F (36.2 C)   Ht 5' (1.524 m)   Wt 260 lb 9.6 oz (118.2 kg)   SpO2 99%   BMI 50.89 kg/m   BP/Weight 01/11/2021 04/22/2020 10/27/2019  Systolic BP 138 - 124  Diastolic BP 84 - 72  Wt. (Lbs) 260.6 242 233.6  BMI 50.89 47.26 45.62    Physical Exam Vitals and nursing note reviewed.  Constitutional:      Appearance: Normal appearance. She is obese. She is not ill-appearing.  HENT:     Head: Normocephalic and atraumatic.     Right Ear: Tympanic membrane normal.     Left Ear: Tympanic membrane normal.     Mouth/Throat:     Pharynx: Oropharynx is clear.  Cardiovascular:     Rate and Rhythm: Normal rate and regular rhythm.  Pulmonary:     Effort: Pulmonary effort is normal.     Breath sounds: No wheezing or rales.  Neurological:     Mental Status: She is alert.   Psychiatric:        Mood and Affect: Mood normal.        Behavior: Behavior normal.    Lab Results  Component Value Date   WBC 10.3 04/18/2020   HGB 13.8 04/18/2020   HCT 41.2 04/18/2020   PLT 286 04/18/2020   GLUCOSE 129 (H) 04/18/2020   CHOL 191 12/29/2020   TRIG 187 (H) 12/29/2020   HDL 46 12/29/2020   LDLCALC 112 (H) 12/29/2020   ALT 17 04/18/2020   AST 21 04/18/2020   NA 140 04/18/2020   K 5.1 04/18/2020   CL 103 04/18/2020   CREATININE 0.73 04/18/2020   BUN 19 04/18/2020   CO2 23 04/18/2020   TSH 7.380 (H) 12/29/2020   HGBA1C 6.7 (H) 12/29/2020     Assessment & Plan:   Problem List Items Addressed This Visit       Cardiovascular and Mediastinum   Essential hypertension    Stable.  Continue lisinopril.        Respiratory   Acute sinusitis - Primary    Treating with Augmentin. Hycodan for cough.      Relevant Medications   HYDROcodone bit-homatropine (HYCODAN) 5-1.5 MG/5ML syrup   amoxicillin-clavulanate (AUGMENTIN) 875-125 MG tablet     Endocrine   Hypothyroidism    Recent dose change.  Labs on/after 12/15.      Relevant Orders   TSH   Diabetes mellitus type 2, uncomplicated (HCC)    Stable/at goal.  We will continue to monitor.        Musculoskeletal and Integument   Primary osteoarthritis of both hands    Starting Meloxicam. Advised to use PRN.      Relevant Medications   meloxicam (MOBIC) 7.5 MG tablet     Other   Hyperlipidemia    We will plan to recheck in the near future.  Patient tolerating increased dose of pravastatin.      Relevant Orders   Lipid Profile    Meds ordered this encounter  Medications   HYDROcodone bit-homatropine (HYCODAN) 5-1.5 MG/5ML syrup    Sig: Take 5 mLs by mouth every 8 (eight) hours as needed for cough.    Dispense:  120 mL    Refill:  0   amoxicillin-clavulanate (AUGMENTIN) 875-125 MG tablet    Sig: Take 1 tablet by mouth 2 (two) times daily.    Dispense:  14 tablet    Refill:  0    meloxicam (MOBIC) 7.5 MG tablet    Sig: Take 1-2 tablets (7.5-15 mg total) by mouth daily as needed for pain.    Dispense:  30 tablet    Refill:  1    Follow-up:  Return in about 6 months (around 07/11/2021) for  Either with me or Eber Jones.  Everlene Other DO [facility]

## 2021-01-24 ENCOUNTER — Other Ambulatory Visit: Payer: Self-pay | Admitting: Nurse Practitioner

## 2021-02-16 DIAGNOSIS — E039 Hypothyroidism, unspecified: Secondary | ICD-10-CM | POA: Diagnosis not present

## 2021-02-16 DIAGNOSIS — E785 Hyperlipidemia, unspecified: Secondary | ICD-10-CM | POA: Diagnosis not present

## 2021-02-17 LAB — TSH: TSH: 1.21 u[IU]/mL (ref 0.450–4.500)

## 2021-02-17 LAB — LIPID PANEL
Chol/HDL Ratio: 3.3 ratio (ref 0.0–4.4)
Cholesterol, Total: 134 mg/dL (ref 100–199)
HDL: 41 mg/dL (ref >39)
LDL Chol Calc (NIH): 65 mg/dL (ref 0–99)
Triglycerides: 161 mg/dL — ABNORMAL HIGH (ref 0–149)
VLDL Cholesterol Cal: 28 mg/dL (ref 5–40)

## 2021-03-02 ENCOUNTER — Telehealth: Payer: Self-pay | Admitting: Family Medicine

## 2021-03-02 NOTE — Telephone Encounter (Signed)
°  Left message for patient to call back and schedule Medicare Annual Wellness Visit (AWV) in office.  ° °If unable to come into the office for AWV,  please offer to do virtually or by telephone. ° °No hx of AWV eligible for AWVI as of 05/28/2019 per [employer] ° °Please schedule at anytime with [facility] Advisor.     ° °40 Minutes appointment  ° °Any questions, please call me at [phone]   °

## 2021-04-04 LAB — HM DIABETES EYE EXAM

## 2021-05-04 ENCOUNTER — Other Ambulatory Visit: Payer: Self-pay | Admitting: Nurse Practitioner

## 2021-05-08 ENCOUNTER — Encounter: Payer: Self-pay | Admitting: Family Medicine

## 2021-05-09 NOTE — Telephone Encounter (Signed)
Ellen Sams, DO   ? ?Please have her schedule an appt regarding this.    ? ?

## 2021-05-18 ENCOUNTER — Telehealth: Payer: Self-pay | Admitting: Family Medicine

## 2021-05-18 DIAGNOSIS — I10 Essential (primary) hypertension: Secondary | ICD-10-CM

## 2021-05-18 DIAGNOSIS — E119 Type 2 diabetes mellitus without complications: Secondary | ICD-10-CM

## 2021-05-18 DIAGNOSIS — E785 Hyperlipidemia, unspecified: Secondary | ICD-10-CM

## 2021-05-18 DIAGNOSIS — Z79899 Other long term (current) drug therapy: Secondary | ICD-10-CM

## 2021-05-18 DIAGNOSIS — E039 Hypothyroidism, unspecified: Secondary | ICD-10-CM

## 2021-05-18 NOTE — Telephone Encounter (Signed)
Pt scheduled follow up appt for June and is wanting to know if she needs labs. Last labs completed 02/16/21 Lipid and TSH. Please advise. Thank you ?

## 2021-05-18 NOTE — Telephone Encounter (Signed)
?  Left message for patient to call back and schedule Medicare Annual Wellness Visit (AWV) in office.  ? ?If unable to come into the office for AWV,  please offer to do virtually or by telephone. ? ?No hx of AWV eligible for AWVI per [employer] as of 05/28/2019 ? ?Please schedule at anytime with [facility] Advisor.     ? ?45 minute appointment  ? ?Any questions, please call me at [phone]   ?

## 2021-05-19 NOTE — Telephone Encounter (Signed)
Lab orders placed and my chart message sent to patient.  

## 2021-05-19 NOTE — Addendum Note (Signed)
Addended by: Marlowe Shores on: 05/19/2021 10:43 AM ? ? Modules accepted: Orders ? ?

## 2021-06-05 ENCOUNTER — Other Ambulatory Visit: Payer: Self-pay | Admitting: Nurse Practitioner

## 2021-06-05 ENCOUNTER — Other Ambulatory Visit: Payer: Self-pay | Admitting: Family Medicine

## 2021-07-04 ENCOUNTER — Ambulatory Visit: Payer: Medicare PPO

## 2021-08-09 DIAGNOSIS — E785 Hyperlipidemia, unspecified: Secondary | ICD-10-CM | POA: Diagnosis not present

## 2021-08-09 DIAGNOSIS — E119 Type 2 diabetes mellitus without complications: Secondary | ICD-10-CM | POA: Diagnosis not present

## 2021-08-09 DIAGNOSIS — E039 Hypothyroidism, unspecified: Secondary | ICD-10-CM | POA: Diagnosis not present

## 2021-08-09 DIAGNOSIS — Z79899 Other long term (current) drug therapy: Secondary | ICD-10-CM | POA: Diagnosis not present

## 2021-08-09 DIAGNOSIS — I10 Essential (primary) hypertension: Secondary | ICD-10-CM | POA: Diagnosis not present

## 2021-08-10 LAB — LIPID PANEL
Chol/HDL Ratio: 3.6 ratio (ref 0.0–4.4)
Cholesterol, Total: 174 mg/dL (ref 100–199)
HDL: 49 mg/dL (ref >39)
LDL Chol Calc (NIH): 96 mg/dL (ref 0–99)
Triglycerides: 165 mg/dL — ABNORMAL HIGH (ref 0–149)
VLDL Cholesterol Cal: 29 mg/dL (ref 5–40)

## 2021-08-10 LAB — MICROALBUMIN / CREATININE URINE RATIO
Creatinine, Urine: 153 mg/dL
Microalb/Creat Ratio: 17 mg/g creat (ref 0–29)
Microalbumin, Urine: 25.3 ug/mL

## 2021-08-10 LAB — COMPREHENSIVE METABOLIC PANEL
ALT: 18 IU/L (ref 0–32)
AST: 19 IU/L (ref 0–40)
Albumin/Globulin Ratio: 1.4 (ref 1.2–2.2)
Albumin: 4.1 g/dL (ref 3.8–4.8)
Alkaline Phosphatase: 132 IU/L — ABNORMAL HIGH (ref 44–121)
BUN/Creatinine Ratio: 22 (ref 12–28)
BUN: 15 mg/dL (ref 8–27)
Bilirubin Total: 0.8 mg/dL (ref 0.0–1.2)
CO2: 23 mmol/L (ref 20–29)
Calcium: 9.1 mg/dL (ref 8.7–10.3)
Chloride: 98 mmol/L (ref 96–106)
Creatinine, Ser: 0.68 mg/dL (ref 0.57–1.00)
Globulin, Total: 2.9 g/dL (ref 1.5–4.5)
Glucose: 213 mg/dL — ABNORMAL HIGH (ref 70–99)
Potassium: 4.8 mmol/L (ref 3.5–5.2)
Sodium: 137 mmol/L (ref 134–144)
Total Protein: 7 g/dL (ref 6.0–8.5)
eGFR: 95 mL/min/{1.73_m2} (ref >59)

## 2021-08-10 LAB — HEMOGLOBIN A1C
Est. average glucose Bld gHb Est-mCnc: 206 mg/dL
Hgb A1c MFr Bld: 8.8 % — ABNORMAL HIGH (ref 4.8–5.6)

## 2021-08-10 LAB — CBC WITH DIFFERENTIAL/PLATELET
Basophils Absolute: 0.1 10*3/uL (ref 0.0–0.2)
Basos: 1 %
EOS (ABSOLUTE): 0.2 10*3/uL (ref 0.0–0.4)
Eos: 2 %
Hematocrit: 42.2 % (ref 34.0–46.6)
Hemoglobin: 14.4 g/dL (ref 11.1–15.9)
Immature Grans (Abs): 0 10*3/uL (ref 0.0–0.1)
Immature Granulocytes: 0 %
Lymphocytes Absolute: 4.1 10*3/uL — ABNORMAL HIGH (ref 0.7–3.1)
Lymphs: 33 %
MCH: 33.3 pg — ABNORMAL HIGH (ref 26.6–33.0)
MCHC: 34.1 g/dL (ref 31.5–35.7)
MCV: 98 fL — ABNORMAL HIGH (ref 79–97)
Monocytes Absolute: 0.9 10*3/uL (ref 0.1–0.9)
Monocytes: 7 %
Neutrophils Absolute: 7.1 10*3/uL — ABNORMAL HIGH (ref 1.4–7.0)
Neutrophils: 57 %
Platelets: 318 10*3/uL (ref 150–450)
RBC: 4.33 x10E6/uL (ref 3.77–5.28)
RDW: 12.1 % (ref 11.7–15.4)
WBC: 12.5 10*3/uL — ABNORMAL HIGH (ref 3.4–10.8)

## 2021-08-10 LAB — TSH: TSH: 2.82 u[IU]/mL (ref 0.450–4.500)

## 2021-08-18 ENCOUNTER — Ambulatory Visit (INDEPENDENT_AMBULATORY_CARE_PROVIDER_SITE_OTHER): Payer: Medicare PPO | Admitting: Nurse Practitioner

## 2021-08-18 ENCOUNTER — Encounter: Payer: Self-pay | Admitting: Nurse Practitioner

## 2021-08-18 VITALS — BP 134/78 | Wt 269.6 lb

## 2021-08-18 DIAGNOSIS — I10 Essential (primary) hypertension: Secondary | ICD-10-CM | POA: Diagnosis not present

## 2021-08-18 DIAGNOSIS — E785 Hyperlipidemia, unspecified: Secondary | ICD-10-CM | POA: Diagnosis not present

## 2021-08-18 DIAGNOSIS — E039 Hypothyroidism, unspecified: Secondary | ICD-10-CM

## 2021-08-18 DIAGNOSIS — Z780 Asymptomatic menopausal state: Secondary | ICD-10-CM

## 2021-08-18 DIAGNOSIS — Z23 Encounter for immunization: Secondary | ICD-10-CM

## 2021-08-18 DIAGNOSIS — E119 Type 2 diabetes mellitus without complications: Secondary | ICD-10-CM

## 2021-08-18 MED ORDER — MAGNESIUM 100 MG PO CAPS
ORAL_CAPSULE | ORAL | Status: AC
Start: 2021-08-18 — End: ?

## 2021-08-18 NOTE — Progress Notes (Signed)
Subjective:    Patient ID: Ellen Ayers, female    DOB: 1955/08/26, 66 y.o.   MRN: [id number]  Diabetes She presents for her follow-up diabetic visit. She has type 2 diabetes mellitus. There are no hypoglycemic associated symptoms. Associated symptoms include fatigue. Pertinent negatives for diabetes include no chest pain. There are no hypoglycemic complications. There are no diabetic complications. Risk factors for coronary artery disease include hypertension. She is following a generally healthy diet. She does not see a podiatrist.Eye exam is current.   Pt states she has gain weight over the course of the last few months; not sleeping well(pt states she thinks about scheduled appts or things to do during the night and it keeps her up). Pt states she will sleep about an hour or so and then wake up and unable to go back to sleep. Pt states she has lost 11 pounds since seeing bloodwork results.  Note that she has been taking her levothyroxine 100 mcg daily and not splitting the dose on Saturday and Sunday, her recent TSH was 2.8. Has had a lot of personal issues going on, states she went through a "bad time".  Had gained back some of her weight but began back on her healthy diet activity when she saw her lab work.  Discontinued her Celexa on her own, does not want to start any medication at this time. Denies any fevers or obvious recent illness especially around the time of her labs.    08/18/2021    1:22 PM  Depression screen PHQ 2/9  Decreased Interest 1  Down, Depressed, Hopeless 2  PHQ - 2 Score 3  Altered sleeping 3  Tired, decreased energy 2  Change in appetite 3  Feeling bad or failure about yourself  3  Trouble concentrating 2  Moving slowly or fidgety/restless 0  Suicidal thoughts 0  PHQ-9 Score 16  Difficult doing work/chores Not difficult at all      08/18/2021    1:22 PM 05/23/2018    9:23 AM  GAD 7 : Generalized Anxiety Score  Nervous, Anxious, on Edge 2 0  Control/stop  worrying 1 0  Worry too much - different things 1 0  Trouble relaxing 0 0  Restless 0 0  Easily annoyed or irritable 0 0  Afraid - awful might happen 0 0  Total GAD 7 Score 4 0  Anxiety Difficulty Not difficult at all Not difficult at all       Review of Systems  Constitutional:  Positive for activity change and fatigue.  Respiratory:  Negative for cough, chest tightness, shortness of breath and wheezing.   Cardiovascular:  Negative for chest pain.  Psychiatric/Behavioral:  Positive for sleep disturbance. Negative for suicidal ideas.        Objective:   Physical Exam NAD.  Alert, oriented.  Calm affect.  Slightly tearful at times.  Thyroid nontender to palpation, no mass or goiter noted.  Lungs clear.  Heart regular rate rhythm.  No murmur or gallop noted.  Making good eye contact.  Speech clear.  Thoughts logical coherent and relevant.  Dressed appropriately for the weather.  Today's Vitals   08/18/21 0907  BP: 134/78  Weight: 269 lb 9.6 oz (122.3 kg)   Body mass index is 52.65 kg/m. Results for orders placed or performed in visit on 05/18/21  CBC with Differential  Result Value Ref Range   WBC 12.5 (H) 3.4 - 10.8 x10E3/uL   RBC 4.33 3.77 - 5.28 x10E6/uL  Hemoglobin 14.4 11.1 - 15.9 g/dL   Hematocrit 13.0 86.5 - 46.6 %   MCV 98 (H) 79 - 97 fL   MCH 33.3 (H) 26.6 - 33.0 pg   MCHC 34.1 31.5 - 35.7 g/dL   RDW 78.4 69.6 - 29.5 %   Platelets 318 150 - 450 x10E3/uL   Neutrophils 57 Not Estab. %   Lymphs 33 Not Estab. %   Monocytes 7 Not Estab. %   Eos 2 Not Estab. %   Basos 1 Not Estab. %   Neutrophils Absolute 7.1 (H) 1.4 - 7.0 x10E3/uL   Lymphocytes Absolute 4.1 (H) 0.7 - 3.1 x10E3/uL   Monocytes Absolute 0.9 0.1 - 0.9 x10E3/uL   EOS (ABSOLUTE) 0.2 0.0 - 0.4 x10E3/uL   Basophils Absolute 0.1 0.0 - 0.2 x10E3/uL   Immature Granulocytes 0 Not Estab. %   Immature Grans (Abs) 0.0 0.0 - 0.1 x10E3/uL  Comprehensive Metabolic Panel (CMET)  Result Value Ref Range    Glucose 213 (H) 70 - 99 mg/dL   BUN 15 8 - 27 mg/dL   Creatinine, Ser 2.84 0.57 - 1.00 mg/dL   eGFR 95 >13 KG/MWN/0.27   BUN/Creatinine Ratio 22 12 - 28   Sodium 137 134 - 144 mmol/L   Potassium 4.8 3.5 - 5.2 mmol/L   Chloride 98 96 - 106 mmol/L   CO2 23 20 - 29 mmol/L   Calcium 9.1 8.7 - 10.3 mg/dL   Total Protein 7.0 6.0 - 8.5 g/dL   Albumin 4.1 3.8 - 4.8 g/dL   Globulin, Total 2.9 1.5 - 4.5 g/dL   Albumin/Globulin Ratio 1.4 1.2 - 2.2   Bilirubin Total 0.8 0.0 - 1.2 mg/dL   Alkaline Phosphatase 132 (H) 44 - 121 IU/L   AST 19 0 - 40 IU/L   ALT 18 0 - 32 IU/L  Lipid Profile  Result Value Ref Range   Cholesterol, Total 174 100 - 199 mg/dL   Triglycerides 253 (H) 0 - 149 mg/dL   HDL 49 >66 mg/dL   VLDL Cholesterol Cal 29 5 - 40 mg/dL   LDL Chol Calc (NIH) 96 0 - 99 mg/dL   Chol/HDL Ratio 3.6 0.0 - 4.4 ratio  TSH  Result Value Ref Range   TSH 2.820 0.450 - 4.500 uIU/mL  Hemoglobin A1c  Result Value Ref Range   Hgb A1c MFr Bld 8.8 (H) 4.8 - 5.6 %   Est. average glucose Bld gHb Est-mCnc 206 mg/dL  Urine Microalbumin w/creat. ratio  Result Value Ref Range   Creatinine, Urine 153.0 Not Estab. mg/dL   Microalbumin, Urine 44.0 Not Estab. ug/mL   Microalb/Creat Ratio 17 0 - 29 mg/g creat   Reviewed labs with patient during visit.       Assessment & Plan:   Problem List Items Addressed This Visit       Cardiovascular and Mediastinum   Essential hypertension - Primary     Endocrine   Diabetes mellitus type 2, uncomplicated (HCC)   Hypothyroidism   Relevant Medications   levothyroxine (SYNTHROID) 100 MCG tablet     Other   Hyperlipidemia   Morbid obesity (HCC)   Other Visit Diagnoses     Need for vaccination       Relevant Orders   Tdap vaccine greater than or equal to 7yo IM (Completed)   Post-menopausal       Relevant Orders   DG Bone Density      Meds ordered this encounter  Medications  Magnesium 100 MG CAPS    Sig: Take one capsule po daily.    levothyroxine (SYNTHROID) 100 MCG tablet    Sig: TAKE 1 TABLET BY MOUTH ONCE DAILY    Dispense:  90 tablet    Refill:  3    Please note new dose    Order Specific Question:   Supervising Provider    Answer:   Lilyan Punt A [[id number]]   Reordered her levothyroxine to 1 tablet daily with refills. Schedule bone density study. Tdap given today. Continue other medications as directed.  Continue efforts at healthy diet, regular activity and weight loss.  She has been very successful in the past with lifestyle changes.  Her initial goal for her A1c is to get back below 7.5. A review of the chart after the visit indicates that she does need a second Pneumovax 23.  Patient will be notified. Recommend colonoscopy and mammogram, patient defers at this time. Encourage patient to schedule Medicare wellness/well woman exam. Return in about 3 months (around 11/18/2021).

## 2021-08-19 ENCOUNTER — Encounter: Payer: Self-pay | Admitting: Nurse Practitioner

## 2021-08-19 ENCOUNTER — Other Ambulatory Visit: Payer: Self-pay | Admitting: Family Medicine

## 2021-08-19 MED ORDER — LEVOTHYROXINE SODIUM 100 MCG PO TABS: ORAL_TABLET | ORAL | 3 refills | Status: DC

## 2021-08-21 ENCOUNTER — Other Ambulatory Visit: Payer: Self-pay | Admitting: Nurse Practitioner

## 2021-08-21 MED ORDER — PRAVASTATIN SODIUM 80 MG PO TABS: 80.0000 mg | ORAL_TABLET | Freq: Every day | ORAL | 1 refills | Status: DC

## 2021-08-21 MED ORDER — ALBUTEROL SULFATE HFA 108 (90 BASE) MCG/ACT IN AERS: 2.0000 | INHALATION_SPRAY | RESPIRATORY_TRACT | 0 refills | Status: DC | PRN

## 2021-08-21 MED ORDER — LISINOPRIL 5 MG PO TABS: 5.0000 mg | ORAL_TABLET | Freq: Every day | ORAL | 1 refills | Status: DC

## 2021-08-21 MED ORDER — HYDROCODONE BIT-HOMATROP MBR 5-1.5 MG/5ML PO SOLN: 5.0000 mL | Freq: Three times a day (TID) | ORAL | 0 refills | Status: DC | PRN

## 2021-08-23 ENCOUNTER — Other Ambulatory Visit (HOSPITAL_COMMUNITY): Payer: BC Managed Care – PPO

## 2021-09-20 ENCOUNTER — Ambulatory Visit (HOSPITAL_COMMUNITY)
Admission: RE | Admit: 2021-09-20 | Discharge: 2021-09-20 | Disposition: A | Discharge status: Home / Self Care | Payer: Medicare PPO | Source: Ambulatory Visit | Attending: Nurse Practitioner | Admitting: Nurse Practitioner

## 2021-09-20 DIAGNOSIS — Z780 Asymptomatic menopausal state: Secondary | ICD-10-CM | POA: Insufficient documentation

## 2021-09-20 DIAGNOSIS — M810 Age-related osteoporosis without current pathological fracture: Secondary | ICD-10-CM | POA: Diagnosis not present

## 2021-09-22 ENCOUNTER — Encounter: Payer: Self-pay | Admitting: Nurse Practitioner

## 2021-09-22 DIAGNOSIS — M810 Age-related osteoporosis without current pathological fracture: Secondary | ICD-10-CM | POA: Insufficient documentation

## 2021-10-06 ENCOUNTER — Encounter: Payer: Self-pay | Admitting: Nurse Practitioner

## 2021-10-07 ENCOUNTER — Other Ambulatory Visit: Payer: Self-pay | Admitting: Nurse Practitioner

## 2021-10-07 MED ORDER — TRULICITY 0.75 MG/0.5ML ~~LOC~~ SOAJ: 0.7500 mg | SUBCUTANEOUS | 0 refills | Status: DC

## 2021-10-28 ENCOUNTER — Other Ambulatory Visit: Payer: Self-pay | Admitting: Nurse Practitioner

## 2021-10-28 MED ORDER — TRULICITY 1.5 MG/0.5ML ~~LOC~~ SOAJ: 1.5000 mg | SUBCUTANEOUS | 0 refills | Status: DC

## 2021-11-17 ENCOUNTER — Other Ambulatory Visit: Payer: Self-pay | Admitting: Nurse Practitioner

## 2021-11-17 ENCOUNTER — Ambulatory Visit: Payer: BC Managed Care – PPO | Admitting: Nurse Practitioner

## 2021-11-17 MED ORDER — TRULICITY 1.5 MG/0.5ML ~~LOC~~ SOAJ
1.5000 mg | SUBCUTANEOUS | 0 refills | Status: DC
Start: 2021-11-17 — End: 2022-04-27

## 2021-11-30 ENCOUNTER — Ambulatory Visit (INDEPENDENT_AMBULATORY_CARE_PROVIDER_SITE_OTHER): Payer: Medicare PPO

## 2021-11-30 VITALS — Wt 269.0 lb

## 2021-11-30 DIAGNOSIS — Z0000 Encounter for general adult medical examination without abnormal findings: Secondary | ICD-10-CM

## 2021-11-30 NOTE — Progress Notes (Signed)
Virtual Visit via Telephone Note  I connected with  [name] on 11/30/21 at 11:15 AM EDT by telephone and verified that I am speaking with the correct person using two identifiers.  Location: Patient: home Provider: [facility] Persons participating in the virtual visit: patient/Nurse Health Advisor   I discussed the limitations, risks, security and privacy concerns of performing an evaluation and management service by telephone and the availability of in person appointments. The patient expressed understanding and agreed to proceed.  Interactive audio and video telecommunications were attempted between this nurse and patient, however failed, due to patient having technical difficulties OR patient did not have access to video capability.  We continued and completed visit with audio only.  Some vital signs may be absent or patient reported.   Hal Hope, LPN  Subjective:   LINAH KLAPPER is a 66 y.o. female who presents for Medicare Annual (Subsequent) preventive examination.  Review of Systems     Cardiac Risk Factors include: advanced age (>6men, >106 women);dyslipidemia     Objective:    There were no vitals filed for this visit. There is no height or weight on file to calculate BMI.     11/30/2021   11:16 AM  Advanced Directives  Does Patient Have a Medical Advance Directive? No  Would patient like information on creating a medical advance directive? No - Patient declined    Current Medications (verified) Outpatient Encounter Medications as of 11/30/2021  Medication Sig   albuterol (VENTOLIN HFA) 108 (90 Base) MCG/ACT inhaler Inhale 2 puffs into the lungs every 4 (four) hours as needed for wheezing or shortness of breath.   aspirin EC 81 MG tablet Take 81 mg by mouth daily. Swallow whole.   cyclobenzaprine (FLEXERIL) 10 MG tablet Take 1 tablet (10 mg total) by mouth 3 (three) times daily as needed for muscle spasms.   Dulaglutide (TRULICITY) 1.5 MG/0.5ML SOPN Inject  1.5 mg into the skin once a week.   levothyroxine (SYNTHROID) 100 MCG tablet TAKE 1 TABLET BY MOUTH ONCE DAILY   lisinopril (ZESTRIL) 5 MG tablet Take 1 tablet (5 mg total) by mouth daily.   Magnesium 100 MG CAPS Take one capsule po daily.   meloxicam (MOBIC) 7.5 MG tablet Take 1-2 tablets (7.5-15 mg total) by mouth daily as needed for pain.   pravastatin (PRAVACHOL) 80 MG tablet Take 1 tablet (80 mg total) by mouth daily.   HYDROcodone bit-homatropine (HYCODAN) 5-1.5 MG/5ML syrup Take 5 mLs by mouth every 8 (eight) hours as needed for cough. (Patient not taking: Reported on 11/30/2021)   No facility-administered encounter medications on file as of 11/30/2021.    Allergies (verified) Metformin and related   History: Past Medical History:  Diagnosis Date   Allergic rhinitis    Hyperlipidemia    Hypothyroidism    IFG (impaired fasting glucose)    Reactive airway disease    Venous stasis    Past Surgical History:  Procedure Laterality Date   CHOLECYSTECTOMY     Family History  Problem Relation Age of Onset   Cancer Mother        colon   Social History   Socioeconomic History   Marital status: Widowed    Spouse name: Not on file   Number of children: Not on file   Years of education: Not on file   Highest education level: Not on file  Occupational History   Not on file  Tobacco Use   Smoking status: Never  Smokeless tobacco: Never  Substance and Sexual Activity   Alcohol use: No    Alcohol/week: 0.0 standard drinks of alcohol   Drug use: No   Sexual activity: Not on file  Other Topics Concern   Not on file  Social History Narrative   Not on file   Social Determinants of Health   Financial Resource Strain: Low Risk  (11/30/2021)   Overall Financial Resource Strain (CARDIA)    Difficulty of Paying Living Expenses: Not hard at all  Food Insecurity: No Food Insecurity (11/30/2021)   Hunger Vital Sign    Worried About Running Out of Food in the Last Year: Never true     Ran Out of Food in the Last Year: Never true  Transportation Needs: No Transportation Needs (11/30/2021)   PRAPARE - Hydrologist (Medical): No    Lack of Transportation (Non-Medical): No  Physical Activity: Inactive (11/30/2021)   Exercise Vital Sign    Days of Exercise per Week: 0 days    Minutes of Exercise per Session: 0 min  Stress: No Stress Concern Present (11/30/2021)   [place]    Feeling of Stress : Not at all  Social Connections: Moderately Isolated (11/30/2021)   Social Connection and Isolation Panel [NHANES]    Frequency of Communication with Friends and Family: More than three times a week    Frequency of Social Gatherings with Friends and Family: More than three times a week    Attends Religious Services: More than 4 times per year    Active Member of [employer] or Organizations: No    Attends Archivist Meetings: Never    Marital Status: Widowed    Tobacco Counseling Counseling given: Not Answered   Clinical Intake:  Pre-visit preparation completed: Yes  Pain : No/denies pain     Nutritional Risks: None Diabetes: Yes CBG done?: No Did pt. bring in CBG monitor from home?: No  How often do you need to have someone help you when you read instructions, pamphlets, or other written materials from your doctor or pharmacy?: 1 - Never  Diabetic?yes Nutrition Risk Assessment:  Has the patient had any N/V/D within the last 2 months?  No  Does the patient have any non-healing wounds?  No  Has the patient had any unintentional weight loss or weight gain?  No   Diabetes:  Is the patient diabetic?  Yes  If diabetic, was a CBG obtained today?  No  Did the patient bring in their glucometer from home?  No  How often do you monitor your CBG's? never.   Financial Strains and Diabetes Management:  Are you having any financial strains with the device, your supplies  or your medication? No .  Does the patient want to be seen by Chronic Care Management for management of their diabetes?  No  Would the patient like to be referred to a Nutritionist or for Diabetic Management?  No   Diabetic Exams:  Diabetic Eye Exam: Completed 04/04/21.  Pt has been advised about the importance in completing this exam. .  Diabetic Foot Exam: Completed 08/18/21. Pt has been advised about the importance in completing this exam.   Interpreter Needed?: No  Information entered by :: Kirke Shaggy, LPN   Activities of Daily Living    11/30/2021   11:17 AM  In your present state of health, do you have any difficulty performing the following activities:  Hearing? 0  Vision? 0  Difficulty concentrating or making decisions? 0  Walking or climbing stairs? 0  Dressing or bathing? 0  Doing errands, shopping? 0  Preparing Food and eating ? N  Using the Toilet? N  In the past six months, have you accidently leaked urine? N  Do you have problems with loss of bowel control? N  Managing your Medications? N  Managing your Finances? N  Housekeeping or managing your Housekeeping? N    Patient Care Team: Tommie Sams, DO as PCP - General (Family Medicine)  Indicate any recent Medical Services you may have received from other than [facility] providers in the past year (date may be approximate).     Assessment:   This is a routine wellness examination for [place].  Hearing/Vision screen Hearing Screening - Comments:: No aids Vision Screening - Comments:: Wears glasses- Dr. Charise Killian  Dietary issues and exercise activities discussed: Current Exercise Habits: The patient does not participate in regular exercise at present   Goals Addressed             This Visit's Progress    DIET - EAT MORE FRUITS AND VEGETABLES         Depression Screen    11/30/2021   11:14 AM 08/18/2021    1:22 PM 04/22/2020    9:05 AM 10/27/2019    9:02 AM 05/23/2018    9:23 AM 12/20/2017    8:14 AM  03/24/2017    7:24 PM  PHQ 2/9 Scores  PHQ - 2 Score 0 3 0 0 4 2 3   PHQ- 9 Score 0 16   11 5 8     Fall Risk    11/30/2021   11:16 AM 04/22/2020    9:05 AM  Fall Risk   Falls in the past year? 0 0  Number falls in past yr: 0   Injury with Fall? 0   Risk for fall due to : No Fall Risks   Follow up Falls prevention discussed;Falls evaluation completed Falls evaluation completed    FALL RISK PREVENTION PERTAINING TO THE HOME:  Any stairs in or around the home? Yes  If so, are there any without handrails? No  Home free of loose throw rugs in walkways, pet beds, electrical cords, etc? Yes  Adequate lighting in your home to reduce risk of falls? Yes   ASSISTIVE DEVICES UTILIZED TO PREVENT FALLS:  Life alert? No  Use of a cane, walker or w/c? No  Grab bars in the bathroom? No  Shower chair or bench in shower? No  Elevated toilet seat or a handicapped toilet? No    Cognitive Function:        11/30/2021   11:19 AM  6CIT Screen  What Year? 0 points  What month? 0 points  What time? 0 points  Count back from 20 0 points  Months in reverse 0 points  Repeat phrase 0 points  Total Score 0 points    Immunizations Immunization History  Administered Date(s) Administered   Influenza,inj,Quad PF,6+ Mos 11/09/2015, 11/16/2016, 11/16/2017   Influenza-Unspecified 12/01/2013   Pneumococcal Conjugate-13 10/27/2019   Pneumococcal Polysaccharide-23 11/09/2015   Tdap 10/27/2011, 08/18/2021   Zoster Recombinat (Shingrix) 08/12/2017, 11/16/2017    TDAP status: Up to date  Flu Vaccine status: Due, Education has been provided regarding the importance of this vaccine. Advised may receive this vaccine at local pharmacy or Health Dept. Aware to provide a copy of the vaccination record if obtained from local pharmacy or Health Dept. Verbalized  acceptance and understanding.  Pneumococcal vaccine status: Up to date  Covid-19 vaccine status: Declined, Education has been provided regarding  the importance of this vaccine but patient still declined. Advised may receive this vaccine at local pharmacy or Health Dept.or vaccine clinic. Aware to provide a copy of the vaccination record if obtained from local pharmacy or Health Dept. Verbalized acceptance and understanding.  Qualifies for Shingles Vaccine? Yes   Zostavax completed No   Shingrix Completed?: Yes  Screening Tests Health Maintenance  Topic Date Due   INFLUENZA VACCINE  09/26/2021   Pneumonia Vaccine 65+ Years old (3 - PPSV23 or PCV20) 01/11/2022 (Originally 11/08/2020)   MAMMOGRAM  08/19/2022 (Originally 06/17/2003)   COLONOSCOPY (Pts 45-29yrs Insurance coverage will need to be confirmed)  08/19/2022 (Originally 06/17/1998)   HEMOGLOBIN A1C  02/08/2022   OPHTHALMOLOGY EXAM  04/04/2022   Diabetic kidney evaluation - GFR measurement  08/10/2022   Diabetic kidney evaluation - Urine ACR  08/10/2022   FOOT EXAM  08/19/2022   TETANUS/TDAP  08/19/2031   DEXA SCAN  Completed   Hepatitis C Screening  Completed   Zoster Vaccines- Shingrix  Completed   HPV VACCINES  Aged Out   COVID-19 Vaccine  Discontinued    Health Maintenance  Health Maintenance Due  Topic Date Due   INFLUENZA VACCINE  09/26/2021    Declined referral for colonoscopy and mammogram   Bone Density status: Completed 09/20/21. Results reflect: Bone density results: OSTEOPOROSIS. Repeat every 2 years.  Lung Cancer Screening: (Low Dose CT Chest recommended if Age 43-80 years, 30 pack-year currently smoking OR have quit w/in 15years.) does not qualify.   Additional Screening:  Hepatitis C Screening: does qualify; Completed 10/27/19  Vision Screening: Recommended annual ophthalmology exams for early detection of glaucoma and other disorders of the eye. Is the patient up to date with their annual eye exam?  Yes  Who is the provider or what is the name of the office in which the patient attends annual eye exams? Dr.Cotter If pt is not established with a  provider, would they like to be referred to a provider to establish care? No .   Dental Screening: Recommended annual dental exams for proper oral hygiene  Community Resource Referral / Chronic Care Management: CRR required this visit?  No   CCM required this visit?  No      Plan:     I have personally reviewed and noted the following in the patient's chart:   Medical and social history Use of alcohol, tobacco or illicit drugs  Current medications and supplements including opioid prescriptions. Patient is currently taking opioid prescriptions. Information provided to patient regarding non-opioid alternatives. Patient advised to discuss non-opioid treatment plan with their provider. Functional ability and status Nutritional status Physical activity Advanced directives List of other physicians Hospitalizations, surgeries, and ER visits in previous 12 months Vitals Screenings to include cognitive, depression, and falls Referrals and appointments  In addition, I have reviewed and discussed with patient certain preventive protocols, quality metrics, and best practice recommendations. A written personalized care plan for preventive services as well as general preventive health recommendations were provided to patient.     Hal Hope, LPN   [id number]   Nurse Notes: none

## 2021-11-30 NOTE — Patient Instructions (Signed)
Ellen Ayers , Thank you for taking time to come for your Medicare Wellness Visit. I appreciate your ongoing commitment to your health goals. Please review the following plan we discussed and let me know if I can assist you in the future.   Screening recommendations/referrals: Colonoscopy: declined referral Mammogram: declined referral Bone Density: 09/20/21, every 2 years Recommended yearly ophthalmology/optometry visit for glaucoma screening and checkup Recommended yearly dental visit for hygiene and checkup  Vaccinations: Influenza vaccine: n/d Pneumococcal vaccine: 10/27/19 Tdap vaccine: 08/18/21 Shingles vaccine: Shingrix 08/12/17, 11/16/17   Covid-19:n/d  Advanced directives: no  Conditions/risks identified: none  Next appointment: Follow up in one year for your annual wellness visit 12/04/22 @ 2pm by phone   Preventive Care 3 Years and Older, Female Preventive care refers to lifestyle choices and visits with your health care provider that can promote health and wellness. What does preventive care include? A yearly physical exam. This is also called an annual well check. Dental exams once or twice a year. Routine eye exams. Ask your health care provider how often you should have your eyes checked. Personal lifestyle choices, including: Daily care of your teeth and gums. Regular physical activity. Eating a healthy diet. Avoiding tobacco and drug use. Limiting alcohol use. Practicing safe sex. Taking low-dose aspirin every day. Taking vitamin and mineral supplements as recommended by your health care provider. What happens during an annual well check? The services and screenings done by your health care provider during your annual well check will depend on your age, overall health, lifestyle risk factors, and family history of disease. Counseling  Your health care provider may ask you questions about your: Alcohol use. Tobacco use. Drug use. Emotional well-being. Home and  relationship well-being. Sexual activity. Eating habits. History of falls. Memory and ability to understand (cognition). Work and work Statistician. Reproductive health. Screening  You may have the following tests or measurements: Height, weight, and BMI. Blood pressure. Lipid and cholesterol levels. These may be checked every 5 years, or more frequently if you are over 46 years old. Skin check. Lung cancer screening. You may have this screening every year starting at age 108 if you have a 30-pack-year history of smoking and currently smoke or have quit within the past 15 years. Fecal occult blood test (FOBT) of the stool. You may have this test every year starting at age 36. Flexible sigmoidoscopy or colonoscopy. You may have a sigmoidoscopy every 5 years or a colonoscopy every 10 years starting at age 24. Hepatitis C blood test. Hepatitis B blood test. Sexually transmitted disease (STD) testing. Diabetes screening. This is done by checking your blood sugar (glucose) after you have not eaten for a while (fasting). You may have this done every 1-3 years. Bone density scan. This is done to screen for osteoporosis. You may have this done starting at age 33. Mammogram. This may be done every 1-2 years. Talk to your health care provider about how often you should have regular mammograms. Talk with your health care provider about your test results, treatment options, and if necessary, the need for more tests. Vaccines  Your health care provider may recommend certain vaccines, such as: Influenza vaccine. This is recommended every year. Tetanus, diphtheria, and acellular pertussis (Tdap, Td) vaccine. You may need a Td booster every 10 years. Zoster vaccine. You may need this after age 69. Pneumococcal 13-valent conjugate (PCV13) vaccine. One dose is recommended after age 59. Pneumococcal polysaccharide (PPSV23) vaccine. One dose is recommended after age 27. Talk  to your health care provider  about which screenings and vaccines you need and how often you need them. This information is not intended to replace advice given to you by your health care provider. Make sure you discuss any questions you have with your health care provider. Document Released: 03/11/2015 Document Revised: 11/02/2015 Document Reviewed: 12/14/2014 Elsevier Interactive Patient Education  2017 [place] Prevention in the Home Falls can cause injuries. They can happen to people of all ages. There are many things you can do to make your home safe and to help prevent falls. What can I do on the outside of my home? Regularly fix the edges of walkways and driveways and fix any cracks. Remove anything that might make you trip as you walk through a door, such as a raised step or threshold. Trim any bushes or trees on the path to your home. Use bright outdoor lighting. Clear any walking paths of anything that might make someone trip, such as rocks or tools. Regularly check to see if handrails are loose or broken. Make sure that both sides of any steps have handrails. Any raised decks and porches should have guardrails on the edges. Have any leaves, snow, or ice cleared regularly. Use sand or salt on walking paths during winter. Clean up any spills in your garage right away. This includes oil or grease spills. What can I do in the bathroom? Use night lights. Install grab bars by the toilet and in the tub and shower. Do not use towel bars as grab bars. Use non-skid mats or decals in the tub or shower. If you need to sit down in the shower, use a plastic, non-slip stool. Keep the floor dry. Clean up any water that spills on the floor as soon as it happens. Remove soap buildup in the tub or shower regularly. Attach bath mats securely with double-sided non-slip rug tape. Do not have throw rugs and other things on the floor that can make you trip. What can I do in the bedroom? Use night lights. Make sure  that you have a light by your bed that is easy to reach. Do not use any sheets or blankets that are too big for your bed. They should not hang down onto the floor. Have a firm chair that has side arms. You can use this for support while you get dressed. Do not have throw rugs and other things on the floor that can make you trip. What can I do in the kitchen? Clean up any spills right away. Avoid walking on wet floors. Keep items that you use a lot in easy-to-reach places. If you need to reach something above you, use a strong step stool that has a grab bar. Keep electrical cords out of the way. Do not use floor polish or wax that makes floors slippery. If you must use wax, use non-skid floor wax. Do not have throw rugs and other things on the floor that can make you trip. What can I do with my stairs? Do not leave any items on the stairs. Make sure that there are handrails on both sides of the stairs and use them. Fix handrails that are broken or loose. Make sure that handrails are as long as the stairways. Check any carpeting to make sure that it is firmly attached to the stairs. Fix any carpet that is loose or worn. Avoid having throw rugs at the top or bottom of the stairs. If you do have throw rugs,  attach them to the floor with carpet tape. Make sure that you have a light switch at the top of the stairs and the bottom of the stairs. If you do not have them, ask someone to add them for you. What else can I do to help prevent falls? Wear shoes that: Do not have high heels. Have rubber bottoms. Are comfortable and fit you well. Are closed at the toe. Do not wear sandals. If you use a stepladder: Make sure that it is fully opened. Do not climb a closed stepladder. Make sure that both sides of the stepladder are locked into place. Ask someone to hold it for you, if possible. Clearly mark and make sure that you can see: Any grab bars or handrails. First and last steps. Where the edge of  each step is. Use tools that help you move around (mobility aids) if they are needed. These include: Canes. Walkers. Scooters. Crutches. Turn on the lights when you go into a dark area. Replace any light bulbs as soon as they burn out. Set up your furniture so you have a clear path. Avoid moving your furniture around. If any of your floors are uneven, fix them. If there are any pets around you, be aware of where they are. Review your medicines with your doctor. Some medicines can make you feel dizzy. This can increase your chance of falling. Ask your doctor what other things that you can do to help prevent falls. This information is not intended to replace advice given to you by your health care provider. Make sure you discuss any questions you have with your health care provider. Document Released: 12/09/2008 Document Revised: 07/21/2015 Document Reviewed: 03/19/2014 Elsevier Interactive Patient Education  2017 [employer].

## 2021-12-08 ENCOUNTER — Ambulatory Visit (INDEPENDENT_AMBULATORY_CARE_PROVIDER_SITE_OTHER): Payer: Medicare PPO | Admitting: Nurse Practitioner

## 2021-12-08 VITALS — BP 158/98 | HR 75 | Temp 97.2°F | Ht 60.0 in | Wt 270.0 lb

## 2021-12-08 DIAGNOSIS — I10 Essential (primary) hypertension: Secondary | ICD-10-CM | POA: Diagnosis not present

## 2021-12-08 DIAGNOSIS — E119 Type 2 diabetes mellitus without complications: Secondary | ICD-10-CM | POA: Diagnosis not present

## 2021-12-08 MED ORDER — BLOOD GLUCOSE MONITOR KIT: PACK | 0 refills | Status: AC

## 2021-12-08 MED ORDER — OMRON 3 SERIES BP MONITOR DEVI: 0 refills | Status: AC

## 2021-12-08 MED ORDER — PHENTERMINE HCL 37.5 MG PO CAPS: 37.5000 mg | ORAL_CAPSULE | ORAL | 2 refills | Status: DC

## 2021-12-08 NOTE — Progress Notes (Unsigned)
Subjective:    Patient ID: Ellen Ayers, female    DOB: [birth year], 66 y.o.   MRN: [id number]  Diabetes She presents for her follow-up diabetic visit. She has type 2 diabetes mellitus. Pertinent negatives for hypoglycemia include no speech difficulty. Associated symptoms include fatigue. Pertinent negatives for diabetes include no chest pain and no weakness.  Presents for recheck on her hypertension and diabetes.  Adherent to medication regimen.  Has been on Trulicity 1.5 mg since her last visit, has not noticed a change in her appetite.  Does not check her blood sugars or blood pressure at home.  Has been trying to eat healthier and stay active.  Struggled at 1 point but has done better lately.  Has taken phentermine in the past which worked very well, only took it a couple of times per week not every day.  Would like to restart this temporarily to jumpstart her weight loss.  Cannot increase her Trulicity dose or change medication since she just received a 90-day supply.  Is interested Mounjaro.  Denies any adverse effects from Trulicity including nausea and vomiting bowel issues or abdominal pain.    Review of Systems  Constitutional:  Positive for fatigue.  HENT:  Negative for sore throat and trouble swallowing.   Respiratory:  Negative for cough, chest tightness, shortness of breath and wheezing.   Cardiovascular:  Negative for chest pain, palpitations and leg swelling.  Gastrointestinal:  Negative for abdominal pain, constipation, diarrhea, nausea and vomiting.  Neurological:  Negative for facial asymmetry, speech difficulty, weakness and numbness.       Objective:   Physical Exam NAD.  Alert, oriented.  Lungs clear.  Heart regular rate rhythm.  No murmur noted.  Carotids no bruits or thrills.  Thyroid nontender to palpation, no mass or goiter noted.  Abdomen obese soft nondistended nontender.  Today's Vitals   12/08/21 1510 12/08/21 1520  BP: (!) 151/95 (!) 158/98  Pulse: 75    Temp: (!) 97.2 F (36.2 C)   SpO2: 96%   Weight: 270 lb (122.5 kg)   Height: 5' (1.524 m)    Body mass index is 52.73 kg/m. BP on recheck right arm sitting with large cuff 158/98.         Assessment & Plan:   Problem List Items Addressed This Visit       Cardiovascular and Mediastinum   Essential hypertension     Endocrine   Diabetes mellitus type 2, uncomplicated ([place]) - Primary   Relevant Orders   Hemoglobin A1c (Completed)   Meds ordered this encounter  Medications   phentermine 37.5 MG capsule    Sig: Take 1 capsule (37.5 mg total) by mouth every morning.    Dispense:  30 capsule    Refill:  2    Order Specific Question:   Supervising Provider    Answer:   Sallee Lange A [[id number]]   Blood Pressure Monitoring (OMRON 3 SERIES BP MONITOR) DEVI    Sig: Use daily as directed.    Dispense:  1 each    Refill:  0    Diagnosis: hypertension (I10)    Order Specific Question:   Supervising Provider    Answer:   Sallee Lange A [[id number]]   blood glucose meter kit and supplies KIT    Sig: Dispense based on patient and insurance preference. Use up to four times daily as directed.    Dispense:  1 each    Refill:  0    Diagnosis:  Type 2 diabetes (E11.9)    Order Specific Question:   Supervising Provider    Answer:   Kathyrn Drown [phone]    Order Specific Question:   Number of strips    Answer:   100    Order Specific Question:   Number of lancets    Answer:   100   Patient encouraged to get her hemoglobin A1c done today to the lab, unavailable at the office. May restart phentermine for use 2-3 times per week as previously done with very good results. Plan to stop this after 3 months and look at increasing the Trulicity or switching to [facility]. Prescription is sent in for blood glucose monitor and supplies an automatic blood pressure cuff for patient to monitor her BP and sugars record results for next visit.   Increase lisinopril to 5 mg 2 tabs daily (10 mg), monitor BP  and send results to the office. Continue healthy lifestyle habits. Return in about 3 months (around 03/10/2022).

## 2021-12-08 NOTE — Patient Instructions (Addendum)
Mounjaro Increase Lisinopril to 2 of the 5 mg tabs once a day (10 mg)

## 2021-12-09 ENCOUNTER — Encounter: Payer: Self-pay | Admitting: Nurse Practitioner

## 2021-12-09 LAB — HEMOGLOBIN A1C
Est. average glucose Bld gHb Est-mCnc: 160 mg/dL
Hgb A1c MFr Bld: 7.2 % — ABNORMAL HIGH (ref 4.8–5.6)

## 2021-12-10 ENCOUNTER — Other Ambulatory Visit: Payer: Self-pay | Admitting: Nurse Practitioner

## 2021-12-10 MED ORDER — HYDROCODONE BIT-HOMATROP MBR 5-1.5 MG/5ML PO SOLN: 5.0000 mL | Freq: Three times a day (TID) | ORAL | 0 refills | Status: DC | PRN

## 2021-12-15 ENCOUNTER — Other Ambulatory Visit: Payer: Self-pay | Admitting: Nurse Practitioner

## 2021-12-15 MED ORDER — CYCLOBENZAPRINE HCL 10 MG PO TABS: 10.0000 mg | ORAL_TABLET | Freq: Three times a day (TID) | ORAL | 0 refills | Status: DC | PRN

## 2022-01-05 ENCOUNTER — Other Ambulatory Visit: Payer: Self-pay | Admitting: Nurse Practitioner

## 2022-01-05 MED ORDER — PHENTERMINE HCL 37.5 MG PO TABS: 37.5000 mg | ORAL_TABLET | Freq: Every day | ORAL | 1 refills | Status: DC

## 2022-01-05 MED ORDER — LISINOPRIL 10 MG PO TABS: 10.0000 mg | ORAL_TABLET | Freq: Every day | ORAL | 1 refills | Status: DC

## 2022-04-02 ENCOUNTER — Other Ambulatory Visit: Payer: Self-pay | Admitting: Nurse Practitioner

## 2022-04-20 ENCOUNTER — Telehealth: Payer: Medicare PPO | Admitting: Nurse Practitioner

## 2022-04-24 ENCOUNTER — Encounter: Payer: Self-pay | Admitting: Nurse Practitioner

## 2022-04-27 ENCOUNTER — Encounter: Payer: Self-pay | Admitting: Nurse Practitioner

## 2022-04-27 ENCOUNTER — Telehealth: Payer: Medicare PPO | Admitting: Nurse Practitioner

## 2022-04-27 VITALS — BP 139/85

## 2022-04-27 DIAGNOSIS — I10 Essential (primary) hypertension: Secondary | ICD-10-CM | POA: Diagnosis not present

## 2022-04-27 DIAGNOSIS — E119 Type 2 diabetes mellitus without complications: Secondary | ICD-10-CM

## 2022-04-27 MED ORDER — TIRZEPATIDE 2.5 MG/0.5ML ~~LOC~~ SOAJ: 2.5000 mg | SUBCUTANEOUS | 0 refills | Status: DC

## 2022-04-27 NOTE — Progress Notes (Addendum)
Subjective:    Patient ID: Ellen Ayers, female    DOB: 01-Oct-1955, 67 y.o.   MRN: [id number] Virtual Visit via Video Note  I connected with Ellen Ayers on 04/27/22 at 11:20 AM EST by a video enabled telemedicine application and verified that I am speaking with the correct person using two identifiers.  Location: Patient: home Provider: office   I discussed the limitations of evaluation and management by telemedicine and the availability of in person appointments. The patient expressed understanding and agreed to proceed.   Nilda Simmer, NP  Diabetes She presents for her follow-up diabetic visit. She has type 2 diabetes mellitus. There are no hypoglycemic associated symptoms. Pertinent negatives for hypoglycemia include no nervousness/anxiousness. Associated symptoms include fatigue. Pertinent negatives for diabetes include no chest pain. There are no hypoglycemic complications. When asked about current treatments, none were reported. She does not see a podiatrist.Eye exam is not current.  BS at home ranging 128-130 earlier in the week; past 3 days 181-196. Eating later at night. Has been under extreme stress due to family issues including finding her brother deceased back in the fall. Defers medication or counseling for bereavement or trauma. Finished Trulicity after her last dose. Did not help her weight. Would like to try [facility]. Has not had her eye exam this year.  Checks her BP at home.  States they are "good".  Does not have any specific numbers at this time.    Review of Systems  Constitutional:  Positive for fatigue.  Respiratory:  Negative for chest tightness and shortness of breath.   Cardiovascular:  Negative for chest pain.  Psychiatric/Behavioral:  Negative for dysphoric mood and sleep disturbance. The patient is not nervous/anxious.        Denies anxiety but has been under extreme stress.       04/27/2022   11:17 AM  Depression screen PHQ 2/9  Decreased  Interest 0  Down, Depressed, Hopeless 0  PHQ - 2 Score 0      04/27/2022   11:18 AM 08/18/2021    1:22 PM 05/23/2018    9:23 AM  GAD 7 : Generalized Anxiety Score  Nervous, Anxious, on Edge 0 2 0  Control/stop worrying 1 1 0  Worry too much - different things 1 1 0  Trouble relaxing 0 0 0  Restless 0 0 0  Easily annoyed or irritable 0 0 0  Afraid - awful might happen 1 0 0  Total GAD 7 Score 3 4 0  Anxiety Difficulty Not difficult at all Not difficult at all Not difficult at all         Objective:   Physical Exam NAD.  Alert, oriented.  Calm cheerful affect.  Visit was done via video but difficulty with visualizing patient. Today's Vitals   04/27/22 1645  BP: 139/85  BP reported from home.  There is no height or weight on file to calculate BMI.      Assessment & Plan:  1. Type 2 diabetes mellitus without complication, without long-term current use of insulin (HCC) Stop Trulicity.  Start [place] as directed.  Again reviewed potential adverse effects.  Discontinue medication and contact office if any problems. - tirzepatide [facility]) 2.5 MG/0.5ML Pen; Inject 2.5 mg into the skin once a week.  Dispense: 2 mL; Refill: 0  2. Essential hypertension Continue lisinopril as directed.  Patient to check her BP at home with her device and call back to the office with results.  3.  Morbid obesity ([place]) Discussed the importance of stress reduction.  Patient has which she describes as a good support system.  Encouraged activity as directed.  Also discussed healthy diet.  Follow-up in 3 months, if she starts Darcel Bayley will need to contact us in 1 month to let us know how this is going. I discussed the assessment and treatment plan with the patient. The patient was provided an opportunity to ask questions and all were answered. The patient agreed with the plan and demonstrated an understanding of the instructions.   The patient was advised to call back or seek an in-person evaluation if  the symptoms worsen or if the condition fails to improve as anticipated.  I provided 15 minutes of non-face-to-face time during this encounter.   Nilda Simmer, NP

## 2022-05-03 ENCOUNTER — Other Ambulatory Visit: Payer: Self-pay | Admitting: Nurse Practitioner

## 2022-05-19 ENCOUNTER — Other Ambulatory Visit: Payer: Self-pay | Admitting: Nurse Practitioner

## 2022-05-26 ENCOUNTER — Encounter: Payer: Self-pay | Admitting: Nurse Practitioner

## 2022-05-28 MED ORDER — TIRZEPATIDE 5 MG/0.5ML ~~LOC~~ SOAJ: 5.0000 mg | SUBCUTANEOUS | 0 refills | Status: DC

## 2022-05-28 NOTE — Telephone Encounter (Signed)
Patient advised of provider's recommendations. Prescription sent electronically to pharmacy. Patient to give update as advised.

## 2022-05-28 NOTE — Telephone Encounter (Signed)
Left message to return call 

## 2022-05-28 NOTE — Telephone Encounter (Signed)
Dr Nicki Reaper recommends 30 day supply due to current tapering of dose and national shortage

## 2022-05-28 NOTE — Telephone Encounter (Signed)
Nurses It would be fine to go up on the dose of Mounjaro 5 mg/week 1 month supply with 2 refills If she is tolerating this well over the next 3 weeks then we can actually go up to the 7.5 but the patient would need to give [place] updates in order to do so plus also keep all regular follow-up visits  As for phenteramine unfortunately I cannot prescribe that.  I recommend sticking with the Mounjaro in going up on the dose every 4 to 8 weeks as tolerated.  Also there has been Engineer, manufacturing systems of this medication and hopefully she will not run into issues regarding this.  Thanks-Dr. Nicki Reaper Wolfgang Phoenix

## 2022-05-28 NOTE — Telephone Encounter (Signed)
Patient aware and verbalized understanding. °

## 2022-06-22 ENCOUNTER — Other Ambulatory Visit: Payer: Self-pay | Admitting: Family Medicine

## 2022-06-26 ENCOUNTER — Telehealth: Payer: Self-pay | Admitting: Family Medicine

## 2022-06-26 NOTE — Telephone Encounter (Signed)
Prescription sent electronically to pharmacy. 

## 2022-06-26 NOTE — Telephone Encounter (Signed)
Refill on Mounjaro sent to [facility]-  -please read message from [employer] on 4/26 prescription wasn't sent in.

## 2022-07-12 ENCOUNTER — Other Ambulatory Visit: Payer: Self-pay | Admitting: Family Medicine

## 2022-07-13 ENCOUNTER — Encounter: Payer: Self-pay | Admitting: Nurse Practitioner

## 2022-07-18 ENCOUNTER — Other Ambulatory Visit: Payer: Self-pay | Admitting: Nurse Practitioner

## 2022-07-18 MED ORDER — TIRZEPATIDE 7.5 MG/0.5ML ~~LOC~~ SOAJ: 7.5000 mg | SUBCUTANEOUS | 0 refills | Status: DC

## 2022-08-10 ENCOUNTER — Other Ambulatory Visit: Payer: Self-pay | Admitting: Nurse Practitioner

## 2022-08-14 LAB — HM DIABETES EYE EXAM

## 2022-08-17 ENCOUNTER — Encounter: Payer: Self-pay | Admitting: Nurse Practitioner

## 2022-08-17 ENCOUNTER — Other Ambulatory Visit: Payer: Self-pay | Admitting: Nurse Practitioner

## 2022-08-17 DIAGNOSIS — Z130 Encounter for screening for diseases of the blood and blood-forming organs and certain disorders involving the immune mechanism: Secondary | ICD-10-CM

## 2022-08-17 DIAGNOSIS — E119 Type 2 diabetes mellitus without complications: Secondary | ICD-10-CM

## 2022-08-17 DIAGNOSIS — I10 Essential (primary) hypertension: Secondary | ICD-10-CM

## 2022-08-17 DIAGNOSIS — E785 Hyperlipidemia, unspecified: Secondary | ICD-10-CM

## 2022-08-17 DIAGNOSIS — E039 Hypothyroidism, unspecified: Secondary | ICD-10-CM

## 2022-08-20 DIAGNOSIS — E785 Hyperlipidemia, unspecified: Secondary | ICD-10-CM | POA: Diagnosis not present

## 2022-08-20 DIAGNOSIS — E119 Type 2 diabetes mellitus without complications: Secondary | ICD-10-CM | POA: Diagnosis not present

## 2022-08-20 DIAGNOSIS — Z130 Encounter for screening for diseases of the blood and blood-forming organs and certain disorders involving the immune mechanism: Secondary | ICD-10-CM | POA: Diagnosis not present

## 2022-08-20 DIAGNOSIS — I10 Essential (primary) hypertension: Secondary | ICD-10-CM | POA: Diagnosis not present

## 2022-08-20 DIAGNOSIS — E039 Hypothyroidism, unspecified: Secondary | ICD-10-CM | POA: Diagnosis not present

## 2022-08-21 LAB — LIPID PANEL
Chol/HDL Ratio: 2.9 ratio (ref 0.0–4.4)
LDL Chol Calc (NIH): 63 mg/dL (ref 0–99)
VLDL Cholesterol Cal: 20 mg/dL (ref 5–40)

## 2022-08-22 LAB — LIPID PANEL
Cholesterol, Total: 126 mg/dL (ref 100–199)
HDL: 43 mg/dL (ref >39)
Triglycerides: 108 mg/dL (ref 0–149)

## 2022-08-22 LAB — CBC WITH DIFFERENTIAL/PLATELET
Basophils Absolute: 0.1 10*3/uL (ref 0.0–0.2)
Basos: 1 %
EOS (ABSOLUTE): 0.2 10*3/uL (ref 0.0–0.4)
Eos: 2 %
Hematocrit: 40.4 % (ref 34.0–46.6)
Hemoglobin: 13.3 g/dL (ref 11.1–15.9)
Immature Grans (Abs): 0 10*3/uL (ref 0.0–0.1)
Immature Granulocytes: 0 %
Lymphocytes Absolute: 2.8 10*3/uL (ref 0.7–3.1)
Lymphs: 29 %
MCH: 32.4 pg (ref 26.6–33.0)
MCHC: 32.9 g/dL (ref 31.5–35.7)
MCV: 98 fL — ABNORMAL HIGH (ref 79–97)
Monocytes Absolute: 0.9 10*3/uL (ref 0.1–0.9)
Monocytes: 10 %
Neutrophils Absolute: 5.7 10*3/uL (ref 1.4–7.0)
Neutrophils: 58 %
Platelets: 330 10*3/uL (ref 150–450)
RBC: 4.11 x10E6/uL (ref 3.77–5.28)
RDW: 12 % (ref 11.7–15.4)
WBC: 9.7 10*3/uL (ref 3.4–10.8)

## 2022-08-22 LAB — COMPREHENSIVE METABOLIC PANEL
ALT: 17 IU/L (ref 0–32)
AST: 23 IU/L (ref 0–40)
Albumin: 4 g/dL (ref 3.9–4.9)
Alkaline Phosphatase: 95 IU/L (ref 44–121)
BUN/Creatinine Ratio: 16 (ref 12–28)
BUN: 15 mg/dL (ref 8–27)
Bilirubin Total: 0.9 mg/dL (ref 0.0–1.2)
CO2: 21 mmol/L (ref 20–29)
Calcium: 9.1 mg/dL (ref 8.7–10.3)
Chloride: 103 mmol/L (ref 96–106)
Creatinine, Ser: 0.93 mg/dL (ref 0.57–1.00)
Globulin, Total: 2.6 g/dL (ref 1.5–4.5)
Glucose: 111 mg/dL — ABNORMAL HIGH (ref 70–99)
Potassium: 4.4 mmol/L (ref 3.5–5.2)
Sodium: 138 mmol/L (ref 134–144)
Total Protein: 6.6 g/dL (ref 6.0–8.5)
eGFR: 67 mL/min/{1.73_m2} (ref >59)

## 2022-08-22 LAB — HEMOGLOBIN A1C
Est. average glucose Bld gHb Est-mCnc: 123 mg/dL
Hgb A1c MFr Bld: 5.9 % — ABNORMAL HIGH (ref 4.8–5.6)

## 2022-08-22 LAB — MICROALBUMIN / CREATININE URINE RATIO
Creatinine, Urine: 186.2 mg/dL
Microalb/Creat Ratio: 18 mg/g creat (ref 0–29)
Microalbumin, Urine: 33.2 ug/mL

## 2022-08-22 LAB — TSH: TSH: 0.422 u[IU]/mL — ABNORMAL LOW (ref 0.450–4.500)

## 2022-08-23 ENCOUNTER — Ambulatory Visit: Payer: Medicare PPO | Admitting: Nurse Practitioner

## 2022-08-23 VITALS — BP 108/72 | HR 76 | Ht 60.0 in | Wt 239.6 lb

## 2022-08-23 DIAGNOSIS — I10 Essential (primary) hypertension: Secondary | ICD-10-CM | POA: Diagnosis not present

## 2022-08-23 DIAGNOSIS — M19041 Primary osteoarthritis, right hand: Secondary | ICD-10-CM | POA: Diagnosis not present

## 2022-08-23 DIAGNOSIS — E785 Hyperlipidemia, unspecified: Secondary | ICD-10-CM

## 2022-08-23 DIAGNOSIS — R5383 Other fatigue: Secondary | ICD-10-CM | POA: Diagnosis not present

## 2022-08-23 DIAGNOSIS — E119 Type 2 diabetes mellitus without complications: Secondary | ICD-10-CM | POA: Diagnosis not present

## 2022-08-23 DIAGNOSIS — E039 Hypothyroidism, unspecified: Secondary | ICD-10-CM | POA: Diagnosis not present

## 2022-08-23 DIAGNOSIS — M19042 Primary osteoarthritis, left hand: Secondary | ICD-10-CM | POA: Diagnosis not present

## 2022-08-23 MED ORDER — CYCLOBENZAPRINE HCL 10 MG PO TABS: 10.0000 mg | ORAL_TABLET | Freq: Three times a day (TID) | ORAL | 0 refills | Status: DC | PRN

## 2022-08-23 MED ORDER — HYDROCODONE BIT-HOMATROP MBR 5-1.5 MG/5ML PO SOLN: 5.0000 mL | Freq: Three times a day (TID) | ORAL | 0 refills | Status: DC | PRN

## 2022-08-23 MED ORDER — ALBUTEROL SULFATE HFA 108 (90 BASE) MCG/ACT IN AERS: 2.0000 | INHALATION_SPRAY | RESPIRATORY_TRACT | 0 refills | Status: DC | PRN

## 2022-08-23 NOTE — Patient Instructions (Addendum)
Cut back to 1/2 tab on Saturday and Sunday Levothyroxine Increase vitamin D supplement to 2000 units per day

## 2022-08-23 NOTE — Progress Notes (Signed)
Subjective:    Patient ID: Ellen Ayers, female    DOB: 09-29-1955, 67 y.o.   MRN: [id number]  HPI Patient is here for follow up. Patient states she needs refills. Patient states the bottom of her feet are itching and tingling especially at night. Doing well with her diet.  Does not check her sugars on a regular basis but running much better.  Defers continuous blood glucose monitor at this time.  Had her eye exam with optometrist last week, report has been requested.  Checks her BP occasionally outside the office which she describes as "normal".  No specific numbers.  Complaints of intermittent extreme fatigue.  Denies any snoring or gasping for air.  [place] questionnaire negative OSA screening.  Staying very active with regular walking program.  Rare use of albuterol mainly with season change.  Slight cough mainly at nighttime, requesting a refill of her hydrocodone cough medicine.  Does not use it every night. Denies any adverse effects of Mounjaro including nausea or vomiting or constipation.  Review of Systems  Constitutional:  Positive for fatigue.  HENT:  Positive for postnasal drip and sinus pressure. Negative for ear pain, sore throat and trouble swallowing.   Respiratory:  Positive for cough. Negative for chest tightness and shortness of breath.   Cardiovascular:  Negative for chest pain and leg swelling.       Objective:   Physical Exam NAD.  Alert, oriented.  TMs mildly retracted, no erythema.  Pharynx clear and moist.  Thyroid nontender to palpation, no mass or goiter noted.  Lungs clear.  Heart regular rate rhythm.  Abdomen soft nondistended nontender. Diabetic Foot Exam - Simple   Simple Foot Form Visual Inspection No deformities, no ulcerations, no other skin breakdown bilaterally: Yes Sensation Testing Intact to touch and monofilament testing bilaterally: Yes Pulse Check Posterior Tibialis and Dorsalis pulse intact bilaterally: Yes Comments    Today's Vitals    08/23/22 0850  BP: 108/72  Pulse: 76  SpO2: 96%  Weight: 239 lb 9.6 oz (108.7 kg)  Height: 5' (1.524 m)   Body mass index is 46.79 kg/m. Results for orders placed or performed in visit on 08/17/22  CBC with Differential/Platelet  Result Value Ref Range   WBC 9.7 3.4 - 10.8 x10E3/uL   RBC 4.11 3.77 - 5.28 x10E6/uL   Hemoglobin 13.3 11.1 - 15.9 g/dL   Hematocrit 40.9 81.1 - 46.6 %   MCV 98 (H) 79 - 97 fL   MCH 32.4 26.6 - 33.0 pg   MCHC 32.9 31.5 - 35.7 g/dL   RDW 91.4 78.2 - 95.6 %   Platelets 330 150 - 450 x10E3/uL   Neutrophils 58 Not Estab. %   Lymphs 29 Not Estab. %   Monocytes 10 Not Estab. %   Eos 2 Not Estab. %   Basos 1 Not Estab. %   Neutrophils Absolute 5.7 1.4 - 7.0 x10E3/uL   Lymphocytes Absolute 2.8 0.7 - 3.1 x10E3/uL   Monocytes Absolute 0.9 0.1 - 0.9 x10E3/uL   EOS (ABSOLUTE) 0.2 0.0 - 0.4 x10E3/uL   Basophils Absolute 0.1 0.0 - 0.2 x10E3/uL   Immature Granulocytes 0 Not Estab. %   Immature Grans (Abs) 0.0 0.0 - 0.1 x10E3/uL  Comprehensive metabolic panel  Result Value Ref Range   Glucose 111 (H) 70 - 99 mg/dL   BUN 15 8 - 27 mg/dL   Creatinine, Ser 2.13 0.57 - 1.00 mg/dL   eGFR 67 >08 MV/HQI/6.96   BUN/Creatinine Ratio 16  12 - 28   Sodium 138 134 - 144 mmol/L   Potassium 4.4 3.5 - 5.2 mmol/L   Chloride 103 96 - 106 mmol/L   CO2 21 20 - 29 mmol/L   Calcium 9.1 8.7 - 10.3 mg/dL   Total Protein 6.6 6.0 - 8.5 g/dL   Albumin 4.0 3.9 - 4.9 g/dL   Globulin, Total 2.6 1.5 - 4.5 g/dL   Bilirubin Total 0.9 0.0 - 1.2 mg/dL   Alkaline Phosphatase 95 44 - 121 IU/L   AST 23 0 - 40 IU/L   ALT 17 0 - 32 IU/L  Hemoglobin A1c  Result Value Ref Range   Hgb A1c MFr Bld 5.9 (H) 4.8 - 5.6 %   Est. average glucose Bld gHb Est-mCnc 123 mg/dL  TSH  Result Value Ref Range   TSH 0.422 (L) 0.450 - 4.500 uIU/mL  Lipid panel  Result Value Ref Range   Cholesterol, Total 126 100 - 199 mg/dL   Triglycerides 409 0 - 149 mg/dL   HDL 43 >81 mg/dL   VLDL Cholesterol Cal 20 5  - 40 mg/dL   LDL Chol Calc (NIH) 63 0 - 99 mg/dL   Chol/HDL Ratio 2.9 0.0 - 4.4 ratio  Microalbumin/Creatinine Ratio, Urine  Result Value Ref Range   Creatinine, Urine 186.2 Not Estab. mg/dL   Microalbumin, Urine 19.1 Not Estab. ug/mL   Microalb/Creat Ratio 18 0 - 29 mg/g creat   Labs reviewed with patient during visit.       Assessment & Plan:   Problem List Items Addressed This Visit       Cardiovascular and Mediastinum   Essential hypertension     Endocrine   Diabetes mellitus type 2, uncomplicated (HCC) - Primary   Hypothyroidism   Relevant Orders   TSH     Musculoskeletal and Integument   Primary osteoarthritis of both hands   Relevant Medications   cyclobenzaprine (FLEXERIL) 10 MG tablet     Other   Hyperlipidemia   Morbid obesity (HCC)   Other Visit Diagnoses     Fatigue, unspecified type          Meds ordered this encounter  Medications   albuterol (VENTOLIN HFA) 108 (90 Base) MCG/ACT inhaler    Sig: Inhale 2 puffs into the lungs every 4 (four) hours as needed for wheezing or shortness of breath.    Dispense:  18 g    Refill:  0   cyclobenzaprine (FLEXERIL) 10 MG tablet    Sig: Take 1 tablet (10 mg total) by mouth 3 (three) times daily as needed. for muscle spams    Dispense:  30 tablet    Refill:  0   HYDROcodone bit-homatropine (HYCODAN) 5-1.5 MG/5ML syrup    Sig: Take 5 mLs by mouth every 8 (eight) hours as needed for cough.    Dispense:  90 mL    Refill:  0   Discussed options regarding thyroid medicine due to low TSH.  Patient to cut back to half a tab of her levothyroxine on Saturday and Sunday and repeat TSH in 8 to 10 weeks.  Also consider switching to generic [place] Thyroid. Contact office if she is having to use her albuterol more than 2-3 times per week. Increase vitamin D supplement to 2000 units/day. Recommend preventive health physical including discussion regarding colonoscopy and mammogram. Continue Mounjaro at current  dose. Return in about 3 months (around 11/23/2022).

## 2022-08-24 ENCOUNTER — Encounter: Payer: Self-pay | Admitting: Nurse Practitioner

## 2022-08-24 ENCOUNTER — Ambulatory Visit: Payer: Medicare PPO | Admitting: Nurse Practitioner

## 2022-09-12 ENCOUNTER — Other Ambulatory Visit: Payer: Self-pay | Admitting: Nurse Practitioner

## 2022-10-02 ENCOUNTER — Other Ambulatory Visit: Payer: Self-pay | Admitting: Nurse Practitioner

## 2022-10-11 ENCOUNTER — Encounter: Payer: Self-pay | Admitting: *Deleted

## 2022-10-25 ENCOUNTER — Encounter: Payer: Self-pay | Admitting: Nurse Practitioner

## 2022-11-06 ENCOUNTER — Other Ambulatory Visit: Payer: Self-pay | Admitting: Family Medicine

## 2022-11-20 DIAGNOSIS — E039 Hypothyroidism, unspecified: Secondary | ICD-10-CM | POA: Diagnosis not present

## 2022-11-21 LAB — TSH: TSH: 3.21 u[IU]/mL (ref 0.450–4.500)

## 2022-11-23 ENCOUNTER — Ambulatory Visit: Payer: Medicare PPO | Admitting: Nurse Practitioner

## 2022-11-23 ENCOUNTER — Other Ambulatory Visit: Payer: Self-pay

## 2022-11-23 ENCOUNTER — Encounter: Payer: Self-pay | Admitting: Nurse Practitioner

## 2022-11-23 VITALS — BP 118/80 | HR 76 | Temp 97.3°F | Ht 60.0 in | Wt 217.0 lb

## 2022-11-23 DIAGNOSIS — E119 Type 2 diabetes mellitus without complications: Secondary | ICD-10-CM

## 2022-11-23 DIAGNOSIS — Z7985 Long-term (current) use of injectable non-insulin antidiabetic drugs: Secondary | ICD-10-CM

## 2022-11-23 DIAGNOSIS — M4014 Other secondary kyphosis, thoracic region: Secondary | ICD-10-CM | POA: Diagnosis not present

## 2022-11-23 DIAGNOSIS — I10 Essential (primary) hypertension: Secondary | ICD-10-CM | POA: Diagnosis not present

## 2022-11-23 DIAGNOSIS — J300 Vasomotor rhinitis: Secondary | ICD-10-CM

## 2022-11-23 DIAGNOSIS — E039 Hypothyroidism, unspecified: Secondary | ICD-10-CM | POA: Diagnosis not present

## 2022-11-23 DIAGNOSIS — M810 Age-related osteoporosis without current pathological fracture: Secondary | ICD-10-CM

## 2022-11-23 MED ORDER — CYCLOBENZAPRINE HCL 10 MG PO TABS: 10.0000 mg | ORAL_TABLET | Freq: Three times a day (TID) | ORAL | 0 refills | Status: DC | PRN

## 2022-11-23 MED ORDER — HYDROCODONE BIT-HOMATROP MBR 5-1.5 MG/5ML PO SOLN: 5.0000 mL | Freq: Three times a day (TID) | ORAL | 0 refills | Status: DC | PRN

## 2022-11-23 MED ORDER — ALBUTEROL SULFATE HFA 108 (90 BASE) MCG/ACT IN AERS: 2.0000 | INHALATION_SPRAY | RESPIRATORY_TRACT | 0 refills | Status: AC | PRN

## 2022-11-23 MED ORDER — ALENDRONATE SODIUM 70 MG PO TABS
70.0000 mg | ORAL_TABLET | ORAL | 0 refills | Status: AC
Start: 2022-11-23 — End: ?

## 2022-11-23 NOTE — Progress Notes (Signed)
Subjective:    Patient ID: Ellen Ayers, female    DOB: [birth year], 67 y.o.   MRN: [id number]  HPI Presents for routine follow-up of her diabetes.  Doing well on Mounjaro 7.5 mg weekly.  Minimal nausea.  No abdominal pain or bowel issues.  Patient does not check her blood sugars at home.  Does check her blood pressure which she states has been normal.  Doing well with her diet.  Also staying active. Has had some "throat congestion" for the past 2 weeks.  Minimal cough.  No wheezing but would like a refill on her albuterol inhaler to have on hand just in case.  Mild brief dizziness off and on for the past 2 weeks.  This began right after the head and throat congestion.  Only last for few seconds.  Sometimes associated with position change otherwise no specific triggers.   Review of Systems  Constitutional:  Negative for fever.  HENT:  Positive for congestion, postnasal drip and sinus pressure. Negative for ear pain, sore throat and trouble swallowing.   Respiratory:  Negative for cough, chest tightness, shortness of breath and wheezing.   Cardiovascular:  Negative for chest pain and leg swelling.  Gastrointestinal:  Negative for nausea and vomiting.  Neurological:  Positive for dizziness. Negative for syncope, facial asymmetry, speech difficulty, weakness, light-headedness and numbness.      11/23/2022    9:16 AM  Depression screen PHQ 2/9  Decreased Interest 1  Down, Depressed, Hopeless 1  PHQ - 2 Score 2  Altered sleeping 0  Tired, decreased energy 0  Change in appetite 0  Feeling bad or failure about yourself  0  Trouble concentrating 0  Moving slowly or fidgety/restless 0  Suicidal thoughts 0  PHQ-9 Score 2  Difficult doing work/chores Not difficult at all      11/23/2022    9:17 AM 08/23/2022    8:55 AM 04/27/2022   11:18 AM 08/18/2021    1:22 PM  GAD 7 : Generalized Anxiety Score  Nervous, Anxious, on Edge 0 0 0 2  Control/stop worrying 1 1 1 1   Worry too much -  different things 2 1 1 1   Trouble relaxing 0 0 0 0  Restless 0 0 0 0  Easily annoyed or irritable 0 0 0 0  Afraid - awful might happen 0 0 1 0  Total GAD 7 Score 3 2 3 4   Anxiety Difficulty Not difficult at all Not difficult at all Not difficult at all Not difficult at all         Objective:   Physical Exam NAD.  Alert, oriented.  Calm cheerful affect.  TMs mild clear effusion bilaterally, no erythema.  Nares clear.  Pharynx clear.  Thyroid nontender to palpation, no mass or goiter noted.  Lungs clear.  Mild kyphosis noted along the upper to mid thoracic spine.  Heart regular rate rhythm. Today's Vitals   11/23/22 0924  BP: 118/80  Pulse: 76  Temp: (!) 97.3 F (36.3 C)  SpO2: 97%  Weight: 217 lb (98.4 kg)  Height: 5' (1.524 m)   Body mass index is 42.38 kg/m.  Recent Results (from the past 2160 hour(s))  TSH     Status: None   Collection Time: 11/20/22  9:22 AM  Result Value Ref Range   TSH 3.210 0.450 - 4.500 uIU/mL        Assessment & Plan:   Problem List Items Addressed This Visit  Cardiovascular and Mediastinum   Essential hypertension     Respiratory   Vasomotor rhinitis     Endocrine   Diabetes mellitus type 2, uncomplicated (HCC) - Primary   Hypothyroidism     Musculoskeletal and Integument   Osteoporosis   Relevant Medications   alendronate (FOSAMAX) 70 MG tablet   Other secondary kyphosis, thoracic region       Meds ordered this encounter  Medications   albuterol (VENTOLIN HFA) 108 (90 Base) MCG/ACT inhaler    Sig: Inhale 2 puffs into the lungs every 4 (four) hours as needed for wheezing or shortness of breath.    Dispense:  18 g    Refill:  0   cyclobenzaprine (FLEXERIL) 10 MG tablet    Sig: Take 1 tablet (10 mg total) by mouth 3 (three) times daily as needed. for muscle spams    Dispense:  30 tablet    Refill:  0   HYDROcodone bit-homatropine (HYCODAN) 5-1.5 MG/5ML syrup    Sig: Take 5 mLs by mouth every 8 (eight) hours as  needed for cough.    Dispense:  90 mL    Refill:  0   alendronate (FOSAMAX) 70 MG tablet    Sig: Take 1 tablet (70 mg total) by mouth every 7 (seven) days. Take with a full glass of water on an empty stomach. Sit upright for at least 30 min    Dispense:  12 tablet    Refill:  0    Order Specific Question:   Supervising Provider    Answer:   Lilyan Punt A [[id number]]   Very rare use of Albuterol. Keeps on hand just in case. Needs new inhaler. Has started having congestion with weather change, would like cough medication on hand. Last refill was 3 months ago. Start Fosamax as directed for osteoporosis and kyphosis. DC if any severe side effects.  Defers colon cancer screening, mammogram and flu vaccine.  Continue other medications as directed. Encouraged continued healthy lifestyle habits.  Return in about 3 months (around 02/22/2023).

## 2022-12-09 ENCOUNTER — Other Ambulatory Visit: Payer: Self-pay | Admitting: Family Medicine

## 2022-12-22 ENCOUNTER — Other Ambulatory Visit: Payer: Self-pay | Admitting: Family Medicine

## 2022-12-31 ENCOUNTER — Other Ambulatory Visit: Payer: Self-pay | Admitting: Family Medicine

## 2023-01-31 ENCOUNTER — Other Ambulatory Visit: Payer: Self-pay | Admitting: Family Medicine

## 2023-02-27 ENCOUNTER — Other Ambulatory Visit: Payer: Self-pay | Admitting: Family Medicine

## 2023-06-21 ENCOUNTER — Other Ambulatory Visit: Payer: Self-pay | Admitting: Family Medicine

## 2023-07-19 ENCOUNTER — Other Ambulatory Visit: Payer: Self-pay | Admitting: Family Medicine

## 2023-07-19 ENCOUNTER — Other Ambulatory Visit: Payer: Self-pay | Admitting: Nurse Practitioner

## 2023-08-13 ENCOUNTER — Other Ambulatory Visit: Payer: Self-pay | Admitting: Family Medicine

## 2023-08-22 ENCOUNTER — Other Ambulatory Visit: Payer: Self-pay | Admitting: Family Medicine

## 2023-08-23 ENCOUNTER — Other Ambulatory Visit: Payer: Self-pay

## 2023-08-23 MED ORDER — LEVOTHYROXINE SODIUM 100 MCG PO TABS: 100.0000 ug | ORAL_TABLET | Freq: Every day | ORAL | 1 refills | Status: DC

## 2023-09-15 ENCOUNTER — Other Ambulatory Visit: Payer: Self-pay | Admitting: Family Medicine

## 2023-09-16 ENCOUNTER — Other Ambulatory Visit: Payer: Self-pay

## 2023-09-16 MED ORDER — MOUNJARO 7.5 MG/0.5ML ~~LOC~~ SOAJ: 7.5000 mg | SUBCUTANEOUS | 1 refills | Status: DC

## 2023-12-12 ENCOUNTER — Other Ambulatory Visit: Payer: Self-pay | Admitting: Nurse Practitioner

## 2023-12-16 ENCOUNTER — Other Ambulatory Visit: Payer: Self-pay | Admitting: Nurse Practitioner

## 2024-01-07 ENCOUNTER — Ambulatory Visit: Payer: Self-pay

## 2024-01-07 NOTE — Telephone Encounter (Signed)
 FYI Only or Action Required?: Action required by provider: clinical question for provider. Appointment scheduled for 01/29/2024, patient would like to be contacted if labs are needed prior to appointment  Patient was last seen in primary care on 11/23/2022 by Ellen Elveria BROCKS, NP.  Called Nurse Triage reporting Hand Pain.  Symptoms began several years ago.  Interventions attempted: Prescription medications: meloxicam .  Symptoms are: overall worsening with activity, but managed.  Triage Disposition: See PCP Within 2 Weeks  Patient/caregiver understands and will follow disposition?: Yes  Copied from CRM [id number]. Topic: Clinical - Red Word Triage >> Jan 07, 2024  3:18 PM Ellen Ayers wrote: Red Word that prompted transfer to Nurse Triage: pain in hands due to arthritis. Reason for Disposition  Hand pain is a chronic symptom (recurrent or ongoing AND present > 4 weeks)  Answer Assessment - Initial Assessment Questions 2. LOCATION: Where is the pain located?     Base of thumbs 3. PAIN: How bad is the pain? (Scale 1-10; or mild, moderate, severe)     At worse, 8/10.  0/10 at rest  5. CAUSE: What do you think is causing the pain?     arthritis 6. AGGRAVATING FACTORS: What makes the pain worse? (e.g., using computer)     Gripping things or trying to do things like open a jar 7. OTHER SYMPTOMS: Do you have any other symptoms? (e.g., fever, neck pain, numbness or tingling, rash, swelling)     denies 8. PREGNANCY: Is there any chance you are pregnant? When was your last menstrual period?     N/a  Protocols used: Hand Pain-A-AH

## 2024-01-08 ENCOUNTER — Encounter: Payer: Self-pay | Admitting: Nurse Practitioner

## 2024-01-08 ENCOUNTER — Other Ambulatory Visit: Payer: Self-pay | Admitting: Nurse Practitioner

## 2024-01-08 DIAGNOSIS — E119 Type 2 diabetes mellitus without complications: Secondary | ICD-10-CM

## 2024-01-08 DIAGNOSIS — I10 Essential (primary) hypertension: Secondary | ICD-10-CM

## 2024-01-08 NOTE — Telephone Encounter (Signed)
Labs ordered and patient notified by my chart.

## 2024-01-24 DIAGNOSIS — E119 Type 2 diabetes mellitus without complications: Secondary | ICD-10-CM | POA: Diagnosis not present

## 2024-01-25 LAB — BASIC METABOLIC PANEL WITH GFR
BUN/Creatinine Ratio: 24 (ref 12–28)
BUN: 21 mg/dL (ref 8–27)
CO2: 24 mmol/L (ref 20–29)
Calcium: 9.1 mg/dL (ref 8.7–10.3)
Chloride: 104 mmol/L (ref 96–106)
Creatinine, Ser: 0.89 mg/dL (ref 0.57–1.00)
Glucose: 93 mg/dL (ref 70–99)
Potassium: 4.5 mmol/L (ref 3.5–5.2)
Sodium: 140 mmol/L (ref 134–144)
eGFR: 70 mL/min/1.73 (ref >59)

## 2024-01-25 LAB — HEMOGLOBIN A1C
Est. average glucose Bld gHb Est-mCnc: 111 mg/dL
Hgb A1c MFr Bld: 5.5 % (ref 4.8–5.6)

## 2024-01-30 ENCOUNTER — Encounter: Payer: Self-pay | Admitting: Nurse Practitioner

## 2024-01-30 ENCOUNTER — Ambulatory Visit: Payer: Self-pay | Admitting: Nurse Practitioner

## 2024-01-30 VITALS — BP 126/82 | Wt 211.1 lb

## 2024-01-30 DIAGNOSIS — E119 Type 2 diabetes mellitus without complications: Secondary | ICD-10-CM

## 2024-01-30 DIAGNOSIS — I10 Essential (primary) hypertension: Secondary | ICD-10-CM

## 2024-01-30 DIAGNOSIS — E785 Hyperlipidemia, unspecified: Secondary | ICD-10-CM

## 2024-01-30 DIAGNOSIS — I83813 Varicose veins of bilateral lower extremities with pain: Secondary | ICD-10-CM | POA: Insufficient documentation

## 2024-01-30 DIAGNOSIS — E039 Hypothyroidism, unspecified: Secondary | ICD-10-CM

## 2024-01-30 DIAGNOSIS — M778 Other enthesopathies, not elsewhere classified: Secondary | ICD-10-CM | POA: Insufficient documentation

## 2024-01-30 DIAGNOSIS — M7521 Bicipital tendinitis, right shoulder: Secondary | ICD-10-CM | POA: Insufficient documentation

## 2024-01-30 NOTE — Progress Notes (Unsigned)
   Subjective:    Patient ID: Ellen Ayers, female    DOB: 1956-01-08, 68 y.o.   MRN: [id number]  HPI Discussed the use of AI scribe software for clinical note transcription with the patient, who gave verbal consent to proceed.  History of Present Illness Ellen Ayers is a 68 year old female with diabetes and hypertension who presents with concerns about weight management and varicose veins.  She is concerned about her weight management, noting that her A1c has improved, but she feels her current medication is ineffective for weight loss.  She has lost 60 pounds over two years, primarily through increased activity during the summer, such as swimming and treading water, but has struggled to maintain this level of activity due to colder weather. She occasionally uses phentermine , taking half a pill sporadically when she feels 'stuck,' and reports it gives her energy and helps her focus away from food. Last prescribed in 2023 with 4 pills left which are outdated. Does not take everyday.   She has varicose veins in both legs, with the right leg being more symptomatic. She describes difficulty getting comfortable at night, though not due to pain, and denies swelling. She has not tried compression stockings yet.  She experiences pain in the base of her thumbs, particularly the right one, when writing or gripping objects. She has been prescribed meloxicam  in the past but does not take it regularly. She describes the pain as soreness and swelling, and she has a thumb splint at home but limited improvement. Has been making ornaments using small writing with her right hand which is completed.   She reports a bicep strain in her right arm, which occurred about a month ago. The pain is described as a strain after helping her husband move a large appliance. She uses Biofreeze at night, which helps her sleep, but she struggles to find a comfortable position.  No chest pain, shortness of breath,  coughing, or wheezing. She reports tingling in her toes at night and occasional itching.     Review of Systems     Objective:   Physical Exam        Assessment & Plan:

## 2024-01-31 MED ORDER — PHENTERMINE HCL 37.5 MG PO TABS: ORAL_TABLET | ORAL | 2 refills | Status: AC

## 2024-02-13 LAB — LIPID PANEL
Chol/HDL Ratio: 3 ratio (ref 0.0–4.4)
Cholesterol, Total: 145 mg/dL (ref 100–199)
HDL: 49 mg/dL (ref >39)
LDL Chol Calc (NIH): 67 mg/dL (ref 0–99)
Triglycerides: 169 mg/dL — ABNORMAL HIGH (ref 0–149)
VLDL Cholesterol Cal: 29 mg/dL (ref 5–40)

## 2024-02-13 LAB — TSH: TSH: 4.07 u[IU]/mL (ref 0.450–4.500)

## 2024-02-13 LAB — MICROALBUMIN / CREATININE URINE RATIO
Creatinine, Urine: 200.5 mg/dL
Microalb/Creat Ratio: 5 mg/g{creat} (ref 0–29)
Microalbumin, Urine: 10.1 ug/mL

## 2024-02-14 ENCOUNTER — Other Ambulatory Visit: Payer: Self-pay | Admitting: Nurse Practitioner

## 2024-02-14 ENCOUNTER — Ambulatory Visit: Payer: Self-pay | Admitting: Nurse Practitioner

## 2024-02-14 MED ORDER — LEVOTHYROXINE SODIUM 100 MCG PO TABS: 100.0000 ug | ORAL_TABLET | Freq: Every day | ORAL | 3 refills | Status: AC

## 2024-02-29 ENCOUNTER — Other Ambulatory Visit: Payer: Self-pay | Admitting: Family Medicine

## 2024-03-05 ENCOUNTER — Ambulatory Visit: Admitting: Family Medicine

## 2024-03-05 ENCOUNTER — Encounter: Payer: Self-pay | Admitting: Family Medicine

## 2024-03-05 ENCOUNTER — Ambulatory Visit (HOSPITAL_COMMUNITY)
Admission: RE | Admit: 2024-03-05 | Discharge: 2024-03-05 | Disposition: A | Discharge status: Home / Self Care | Source: Ambulatory Visit | Attending: Family Medicine | Admitting: Family Medicine

## 2024-03-05 ENCOUNTER — Other Ambulatory Visit: Payer: Self-pay | Admitting: Family Medicine

## 2024-03-05 ENCOUNTER — Telehealth: Payer: Self-pay | Admitting: *Deleted

## 2024-03-05 ENCOUNTER — Ambulatory Visit: Payer: Self-pay | Admitting: Family Medicine

## 2024-03-05 VITALS — BP 129/80 | HR 73 | Temp 97.2°F | Ht 60.0 in | Wt 214.4 lb

## 2024-03-05 DIAGNOSIS — S46011A Strain of muscle(s) and tendon(s) of the rotator cuff of right shoulder, initial encounter: Secondary | ICD-10-CM | POA: Diagnosis present

## 2024-03-05 DIAGNOSIS — S46011D Strain of muscle(s) and tendon(s) of the rotator cuff of right shoulder, subsequent encounter: Secondary | ICD-10-CM | POA: Diagnosis not present

## 2024-03-05 MED ORDER — DICLOFENAC SODIUM 75 MG PO TBEC: 75.0000 mg | DELAYED_RELEASE_TABLET | Freq: Two times a day (BID) | ORAL | 1 refills | Status: AC | PRN

## 2024-03-05 NOTE — Assessment & Plan Note (Addendum)
 Patient's history and physical exam consistent with rotator cuff pathology.  Placing on diclofenac .  Referring to physical therapy.  X-ray today.

## 2024-03-05 NOTE — Progress Notes (Signed)
 "  Subjective:  Patient ID: Ellen Ayers, female    DOB: 16-May-1955  Age: 69 y.o. MRN: [id number]  CC:   Chief Complaint  Patient presents with   Follow-up    F/U for right bicep tear/tendon strain per carolyn. Stated she was told to do exercises but it hurts too much so she hasn't done them to prevent further injury. C/O of left arm hurting now but is using it more. OTC Ibuprofen does help pt relax but struggling to sleep due to being uncomfortable. Pt states she does not want it to get to the point of needing surgery.    HPI:  69 year old female presents for evaluation of the above.  Patient seen in December.  There was concern that she may have a bicep strain or tendinitis.  Patient presents today for follow-up regarding this.  She continues to have pain, but her pain is localized to the right lateral upper arm/shoulder.  Ibuprofen helps.  Worse with activity.  Worse at night when she is lying on that side.  Decreased range of motion of the shoulder with associated pain.  Patient Active Problem List   Diagnosis Date Noted   Rotator cuff strain, right, initial encounter 03/05/2024   Thumb tendonitis 01/30/2024   Varicose veins of both lower extremities with pain 01/30/2024   Vasomotor rhinitis 11/23/2022   Other secondary kyphosis, thoracic region 11/23/2022   Osteoporosis 09/22/2021   Primary osteoarthritis of both hands 01/11/2021   Anxiety 07/09/2014   Vitamin D  deficiency 07/09/2014   Diabetes mellitus type 2, uncomplicated (HCC) 07/06/2014   Morbid obesity (HCC) 12/06/2013   Essential hypertension 10/06/2013   Hypothyroidism 06/09/2012   Hyperlipidemia 06/09/2012    Social Hx   Social History   Socioeconomic History   Marital status: Widowed    Spouse name: Not on file   Number of children: Not on file   Years of education: Not on file   Highest education level: Not on file  Occupational History   Not on file  Tobacco Use   Smoking status: Never   Smokeless  tobacco: Never  Substance and Sexual Activity   Alcohol use: No    Alcohol/week: 0.0 standard drinks of alcohol   Drug use: No   Sexual activity: Not on file  Other Topics Concern   Not on file  Social History Narrative   Not on file   Social Drivers of Health   Tobacco Use: Low Risk (03/05/2024)   Patient History    Smoking Tobacco Use: Never    Smokeless Tobacco Use: Never    Passive Exposure: Not on file  Financial Resource Strain: Low Risk (11/30/2021)   Overall Financial Resource Strain (CARDIA)    Difficulty of Paying Living Expenses: Not hard at all  Food Insecurity: No Food Insecurity (11/30/2021)   Hunger Vital Sign    Worried About Running Out of Food in the Last Year: Never true    Ran Out of Food in the Last Year: Never true  Transportation Needs: No Transportation Needs (11/30/2021)   PRAPARE - Administrator, Civil Service (Medical): No    Lack of Transportation (Non-Medical): No  Physical Activity: Inactive (11/30/2021)   Exercise Vital Sign    Days of Exercise per Week: 0 days    Minutes of Exercise per Session: 0 min  Stress: No Stress Concern Present (11/30/2021)   [employer] of Occupational Health - Occupational Stress Questionnaire    Feeling of Stress : Not at  all  Social Connections: Moderately Isolated (11/30/2021)   Social Connection and Isolation Panel    Frequency of Communication with Friends and Family: More than three times a week    Frequency of Social Gatherings with Friends and Family: More than three times a week    Attends Religious Services: More than 4 times per year    Active Member of [employer] or Organizations: No    Attends Banker Meetings: Never    Marital Status: Widowed  Depression (PHQ2-9): Low Risk (03/05/2024)   Depression (PHQ2-9)    PHQ-2 Score: 3  Alcohol Screen: Low Risk (11/30/2021)   Alcohol Screen    Last Alcohol Screening Score (AUDIT): 0  Housing: Low Risk (11/30/2021)   Housing    Last Housing  Risk Score: 0  Utilities: Not At Risk (11/30/2021)   AHC Utilities    Threatened with loss of utilities: No  Health Literacy: Not on file    Review of Systems Per HPI  Objective:  BP 129/80   Pulse 73   Temp (!) 97.2 F (36.2 C)   Ht 5' (1.524 m)   Wt 214 lb 6.4 oz (97.3 kg)   SpO2 99%   BMI 41.87 kg/m      03/05/2024    9:39 AM 01/30/2024    9:45 AM 11/23/2022    9:24 AM  BP/Weight  Systolic BP 129 126 118  Diastolic BP 80 82 80  Wt. (Lbs) 214.4 211.12 217  BMI 41.87 kg/m2 41.23 kg/m2 42.38 kg/m2    Physical Exam Vitals and nursing note reviewed.  Constitutional:      Appearance: Normal appearance. She is obese.  HENT:     Head: Normocephalic and atraumatic.  Musculoskeletal:     Comments: Right shoulder: Inspection reveals no abnormalities, atrophy or asymmetry. Tenderness over the bicipital groove. Range of motion decreased in abduction and flexion. Rotator cuff strength normal throughout. + Hawkin's test. Painful arc.    Neurological:     Mental Status: She is alert.     Lab Results  Component Value Date   WBC 9.7 08/20/2022   HGB 13.3 08/20/2022   HCT 40.4 08/20/2022   PLT 330 08/20/2022   GLUCOSE 93 01/24/2024   CHOL 145 02/12/2024   TRIG 169 (H) 02/12/2024   HDL 49 02/12/2024   LDLCALC 67 02/12/2024   ALT 17 08/20/2022   AST 23 08/20/2022   NA 140 01/24/2024   K 4.5 01/24/2024   CL 104 01/24/2024   CREATININE 0.89 01/24/2024   BUN 21 01/24/2024   CO2 24 01/24/2024   TSH 4.070 02/12/2024   HGBA1C 5.5 01/24/2024     Assessment & Plan:  Rotator cuff strain, right, initial encounter Assessment & Plan: Patient's history and physical exam consistent with rotator cuff pathology.  Placing on diclofenac .  Referring to physical therapy.  X-ray today.  Orders: -     Diclofenac  Sodium; Take 1 tablet (75 mg total) by mouth 2 (two) times daily as needed for moderate pain (pain score 4-6).  Dispense: 60 tablet; Refill: 1 -     Ambulatory  referral to Physical Therapy -     DG Shoulder Right    Follow-up:  Return if symptoms worsen or fail to improve.  Jacqulyn Ahle DO [facility] "

## 2024-03-05 NOTE — Telephone Encounter (Signed)
 Copied from CRM [id number]. Topic: Referral - Status >> Mar 05, 2024 10:54 AM Gustabo D wrote: [facility] -Needs referral to be changed to occupational

## 2024-03-05 NOTE — Patient Instructions (Signed)
 Medication as prescribed.  Referring for PT.  Xray at the hospital.

## 2024-03-06 ENCOUNTER — Other Ambulatory Visit: Payer: Self-pay

## 2024-03-06 DIAGNOSIS — S46011A Strain of muscle(s) and tendon(s) of the rotator cuff of right shoulder, initial encounter: Secondary | ICD-10-CM

## 2024-03-06 NOTE — Telephone Encounter (Signed)
 It is changed to occupational

## 2024-03-06 NOTE — Telephone Encounter (Signed)
 Cook, Jayce G, [place]     03/05/24  6:41 PM Please change

## 2024-03-10 ENCOUNTER — Ambulatory Visit (HOSPITAL_COMMUNITY): Attending: Family Medicine | Admitting: Occupational Therapy

## 2024-03-10 ENCOUNTER — Encounter (HOSPITAL_COMMUNITY): Payer: Self-pay | Admitting: Occupational Therapy

## 2024-03-10 ENCOUNTER — Other Ambulatory Visit: Payer: Self-pay

## 2024-03-10 DIAGNOSIS — M25511 Pain in right shoulder: Secondary | ICD-10-CM | POA: Insufficient documentation

## 2024-03-10 DIAGNOSIS — R29898 Other symptoms and signs involving the musculoskeletal system: Secondary | ICD-10-CM | POA: Insufficient documentation

## 2024-03-10 DIAGNOSIS — S46011A Strain of muscle(s) and tendon(s) of the rotator cuff of right shoulder, initial encounter: Secondary | ICD-10-CM | POA: Diagnosis not present

## 2024-03-10 DIAGNOSIS — G8929 Other chronic pain: Secondary | ICD-10-CM | POA: Diagnosis present

## 2024-03-10 NOTE — Patient Instructions (Signed)

## 2024-03-10 NOTE — Therapy (Signed)
 " OUTPATIENT OCCUPATIONAL THERAPY ORTHO EVALUATION  Patient Name: Ellen Ayers MRN: [id number] DOB:08/19/1955, 69 y.o., female Today's Date: 03/10/2024   END OF SESSION:  OT End of Session - 03/10/24 1002     Visit Number 1    Number of Visits 8    Date for Recertification  04/09/24    Authorization Type [employer]    Authorization Time Period requesting auth    Authorization - Visit Number 0    Authorization - Number of Visits 8    Progress Note Due on Visit 10    OT Start Time 0924    OT Stop Time 0953    OT Time Calculation (min) 29 min    Activity Tolerance Patient tolerated treatment well    Behavior During Therapy [facility] for tasks assessed/performed          Past Medical History:  Diagnosis Date   Acute sinusitis 01/11/2021   Allergic rhinitis    Hyperlipidemia    Hypothyroidism    IFG (impaired fasting glucose)    Reactive airway disease    Venous stasis    Past Surgical History:  Procedure Laterality Date   CHOLECYSTECTOMY     Patient Active Problem List   Diagnosis Date Noted   Rotator cuff strain, right, initial encounter 03/05/2024   Thumb tendonitis 01/30/2024   Varicose veins of both lower extremities with pain 01/30/2024   Vasomotor rhinitis 11/23/2022   Other secondary kyphosis, thoracic region 11/23/2022   Osteoporosis 09/22/2021   Primary osteoarthritis of both hands 01/11/2021   Anxiety 07/09/2014   Vitamin D  deficiency 07/09/2014   Diabetes mellitus type 2, uncomplicated (HCC) 07/06/2014   Morbid obesity (HCC) 12/06/2013   Essential hypertension 10/06/2013   Hypothyroidism 06/09/2012   Hyperlipidemia 06/09/2012    PCP: Jacqulyn Ahle, DO  REFERRING PROVIDER: Jacqulyn Ahle, DO  ONSET DATE: August 2025  REFERRING DIAG: S46.011A (ICD-10-CM) - Rotator cuff strain, right, initial encounter   THERAPY DIAG:  Chronic right shoulder pain  Other symptoms and signs involving the musculoskeletal system  Rationale for Evaluation and Treatment:  Rehabilitation  SUBJECTIVE:   SUBJECTIVE STATEMENT: S: I think I injured it in August moving a refrigerator. Pt accompanied by: self  PERTINENT HISTORY: Pt is a 69 y/o female who injured her right shoulder in August 2025 when she was moving a refrigerator  to clean under it, moving it several times. It has gotten progressively worse. Pt has had an x-ray but no additional imaging.   PRECAUTIONS: None  WEIGHT BEARING RESTRICTIONS: No  PAIN:  Are you having pain? No and had a pain pill this morning. Pt reports if she is just sitting it does not hurt, but if she goes to the right or up on the right side it is terrible.   FALLS: Has patient fallen in last 6 months? No  PLOF: Independent  PATIENT GOALS: To have less pain and more mobility.   NEXT MD VISIT: None scheduled  OBJECTIVE:   HAND DOMINANCE: Right  ADLs: Overall ADLs: Pt reports difficulty with reaching behind her back, up to the right, or out to the right is difficulty. Putting her pocketbook on the passenger seat is difficult. Sometimes bothers her at night, takes pain medication to sleep. Pt uses compensatory strategies for dressing and bathing. Pt reports she can manage most tasks taking pain pills, but without pain medication pt has extreme difficulty with task completion.   FUNCTIONAL OUTCOME MEASURES: UEFS  Extreme difficulty/unable (0), Quite a bit of  difficulty (1), Moderate difficulty (2), Little difficulty (3), No difficulty (4) Survey date:  03/10/24  Any of your usual work, household or school activities 3  2. Your usual hobbies, recreational/sport activities 1   3. Lifting a bag of groceries to waist level 2   4. Lifting a bag of groceries above your head 0  5. Grooming your hair 2  6. Pushing up on your hands (I.e. from bathtub or chair) 3  7. Preparing food (I.e. peeling/cutting) 3  8. Driving  4  9. Vacuuming, sweeping, or raking 0  10. Dressing  4  11. Doing up buttons 4  12. Using tools/appliances  4  13. Opening doors 4  14. Cleaning  4  15. Tying or lacing shoes 4  16. Sleeping  1  17. Laundering clothes (I.e. washing, ironing, folding) 4  18. Opening a jar 2  19. Throwing a ball 0  20. Carrying a small suitcase with your affected limb.  2  Score total:  51/80=64%      UPPER EXTREMITY ROM:       Assessed in sitting, er/IR adducted  Active ROM Right eval  Shoulder flexion 121  Shoulder abduction 87  Shoulder internal rotation 90  Shoulder external rotation 50  (Blank rows = not tested)    UPPER EXTREMITY MMT:     Assessed in sitting, er/IR adducted  MMT Right eval  Shoulder flexion 4+/5  Shoulder abduction 3-/5  Shoulder internal rotation 5/5  Shoulder external rotation 5/5  Elbow flexion 4+/5  Elbow extension 5/5  (Blank rows = not tested)  HAND FUNCTION: Grip strength: Right: 45 lbs; Left: 47 lbs  SENSATION: WFL  EDEMA: None  COGNITION: Overall cognitive status: Within functional limits for tasks assessed  OBSERVATIONS: mod fascial restrictions along upper arm, anterior shoulder, and trapezius   TODAY'S TREATMENT:                                                                                                                              DATE:  Eval -AA/ROM: sitting-protraction, flexion, horizontal abduction, er, abduction, 10 reps     PATIENT EDUCATION: Education details: AA/ROM Person educated: Patient Education method: Programmer, Multimedia, Facilities Manager, and Handouts Education comprehension: verbalized understanding and returned demonstration  HOME EXERCISE PROGRAM: Eval: AA/ROM   GOALS: Goals reviewed with patient? Yes   SHORT TERM GOALS: Target date: 04/10/24  Pt will be provided with and educated on HEP to improve mobility in RUE required for use during ADL completion.   Goal status: INITIAL  2.  Pt will increase RUE P/ROM by 30+ degrees to improve ability to use RUE during dressing tasks with minimal compensatory techniques.    Goal status: INITIAL  3.  Pt will increase RUE strength to 5/5 to improve ability to use RUE when lifting or carrying items during meal preparation/housework/yardwork tasks.  Goal status: INITIAL  4.  Pt will decrease pain in RUE to 3/10 or less to improve  ability to sleep for 2+ consecutive hours without waking due to pain.   Goal status: INITIAL  5.  Pt will decrease RUE fascial restrictions to min amounts or less to improve mobility required for functional reaching tasks.   Goal status: INITIAL     ASSESSMENT:  CLINICAL IMPRESSION: Patient is a 69 y.o. female who was seen today for occupational therapy evaluation for right shoulder pain. Pt presents with increased pain and fascial restrictions, decreased ROM, strength, and functional use of the RUE since August of 2025. Pt reports she is managing with pain medication, tried HEP from the internet but was unable to complete. Pt without popeye deformity on evaluation. Pt reports pain began at the bicep and has radiated up to the shoulder recently.   PERFORMANCE DEFICITS: in functional skills including in functional skills including ADLs, IADLs, coordination, tone, ROM, strength, pain, fascial restrictions, muscle spasms, and UE functional use.   IMPAIRMENTS: are limiting patient from ADLs, IADLs, rest and sleep, and leisure.   COMORBIDITIES: has no other co-morbidities that affects occupational performance. Patient will benefit from skilled OT to address above impairments and improve overall function.  MODIFICATION OR ASSISTANCE TO COMPLETE EVALUATION: No modification of tasks or assist necessary to complete an evaluation.  OT OCCUPATIONAL PROFILE AND HISTORY: Problem focused assessment: Including review of records relating to presenting problem.  CLINICAL DECISION MAKING: LOW - limited treatment options, no task modification necessary  REHAB POTENTIAL: Good  EVALUATION COMPLEXITY: Low      PLAN:  OT FREQUENCY:  2x/week  OT DURATION: 4 weeks  PLANNED INTERVENTIONS: 97168 OT Re-evaluation, 97535 self care/ADL training, 02889 therapeutic exercise, 97530 therapeutic activity, 97112 neuromuscular re-education, 97140 manual therapy, 97014 electrical stimulation unattended, patient/family education, and DME and/or AE instructions  RECOMMENDED OTHER SERVICES: None at this time  CONSULTED AND AGREED WITH PLAN OF CARE: Patient  PLAN FOR NEXT SESSION: Follow up on HEP, initiate manual techniques, AA/ROM progressing to A/ROM, functional reaching, shoulder stretches   Sonny Cory, OTR/L  [phone] 03/10/2024, 10:03 AM    [employer] Auth Request Treatment Start Date: 03/10/24  Referring diagnosis code (ICD 10)? D53.988J Treatment diagnosis codes (ICD 10)? (if different than referring diagnosis) M25.511, G89.29, R29.898 What was this (referring dx) caused by? []  Surgery []  Fall [x]  Ongoing issue []  Arthritis []  Other: ____________  Laterality: [x]  Rt []  Lt []  Both  Deficits: [x]  Pain [x]  Stiffness [x]  Weakness []  Edema []  Balance Deficits []  Coordination []  Gait Disturbance [x]  ROM []  Other   Functional Tool Score: UEFS 64%  CPT codes: See Planned Interventions listed in the Plan section of the Evaluation.   "

## 2024-03-26 ENCOUNTER — Encounter (HOSPITAL_COMMUNITY): Payer: Self-pay | Admitting: Occupational Therapy

## 2024-03-26 ENCOUNTER — Ambulatory Visit (HOSPITAL_COMMUNITY): Admitting: Occupational Therapy

## 2024-03-26 DIAGNOSIS — G8929 Other chronic pain: Secondary | ICD-10-CM

## 2024-03-26 DIAGNOSIS — R29898 Other symptoms and signs involving the musculoskeletal system: Secondary | ICD-10-CM

## 2024-03-26 DIAGNOSIS — M25511 Pain in right shoulder: Secondary | ICD-10-CM | POA: Diagnosis not present

## 2024-03-26 NOTE — Therapy (Signed)
 " OUTPATIENT OCCUPATIONAL THERAPY ORTHO TREATMENT  Patient Name: Ellen Ayers MRN: [id number] DOB:[birth year], 69 y.o., female Today's Date: 03/26/2024   END OF SESSION:  OT End of Session - 03/26/24 1052     Visit Number 2    Number of Visits 8    Date for Recertification  04/09/24    Authorization Type [employer]    Authorization Time Period 8 visits approved 1/13-2/12    Authorization - Visit Number 2    Authorization - Number of Visits 8    Progress Note Due on Visit 10    OT Start Time 0953    OT Stop Time 1031    OT Time Calculation (min) 38 min    Activity Tolerance Patient tolerated treatment well    Behavior During Therapy [facility] for tasks assessed/performed           Past Medical History:  Diagnosis Date   Acute sinusitis 01/11/2021   Allergic rhinitis    Hyperlipidemia    Hypothyroidism    IFG (impaired fasting glucose)    Reactive airway disease    Venous stasis    Past Surgical History:  Procedure Laterality Date   CHOLECYSTECTOMY     Patient Active Problem List   Diagnosis Date Noted   Rotator cuff strain, right, initial encounter 03/05/2024   Thumb tendonitis 01/30/2024   Varicose veins of both lower extremities with pain 01/30/2024   Vasomotor rhinitis 11/23/2022   Other secondary kyphosis, thoracic region 11/23/2022   Osteoporosis 09/22/2021   Primary osteoarthritis of both hands 01/11/2021   Anxiety 07/09/2014   Vitamin D  deficiency 07/09/2014   Diabetes mellitus type 2, uncomplicated (HCC) 07/06/2014   Morbid obesity (HCC) 12/06/2013   Essential hypertension 10/06/2013   Hypothyroidism 06/09/2012   Hyperlipidemia 06/09/2012    PCP: Jacqulyn Ahle, DO  REFERRING PROVIDER: Jacqulyn Ahle, DO  ONSET DATE: August 2025  REFERRING DIAG: S46.011A (ICD-10-CM) - Rotator cuff strain, right, initial encounter   THERAPY DIAG:  Chronic right shoulder pain  Other symptoms and signs involving the musculoskeletal system  Rationale for Evaluation  and Treatment: Rehabilitation  SUBJECTIVE:   SUBJECTIVE STATEMENT: S: I think I overdid my exercises  PERTINENT HISTORY: Pt is a 69 y/o female who injured her right shoulder in August 2025 when she was moving a refrigerator  to clean under it, moving it several times. It has gotten progressively worse. Pt has had an x-ray but no additional imaging.   PRECAUTIONS: None  WEIGHT BEARING RESTRICTIONS: No  PAIN:  Are you having pain? Yes: NPRS scale: 4/10 Pain location: right shoulder Pain description: sore Aggravating factors: picking up wood Relieving factors: rest  FALLS: Has patient fallen in last 6 months? No  PLOF: Independent  PATIENT GOALS: To have less pain and more mobility.   NEXT MD VISIT: None scheduled  OBJECTIVE:   HAND DOMINANCE: Right  ADLs: Overall ADLs: Pt reports difficulty with reaching behind her back, up to the right, or out to the right is difficulty. Putting her pocketbook on the passenger seat is difficult. Sometimes bothers her at night, takes pain medication to sleep. Pt uses compensatory strategies for dressing and bathing. Pt reports she can manage most tasks taking pain pills, but without pain medication pt has extreme difficulty with task completion.   FUNCTIONAL OUTCOME MEASURES: UEFS  Extreme difficulty/unable (0), Quite a bit of difficulty (1), Moderate difficulty (2), Little difficulty (3), No difficulty (4) Survey date:  03/10/24  Any of your usual work, household  or school activities 3  2. Your usual hobbies, recreational/sport activities 1   3. Lifting a bag of groceries to waist level 2   4. Lifting a bag of groceries above your head 0  5. Grooming your hair 2  6. Pushing up on your hands (I.e. from bathtub or chair) 3  7. Preparing food (I.e. peeling/cutting) 3  8. Driving  4  9. Vacuuming, sweeping, or raking 0  10. Dressing  4  11. Doing up buttons 4  12. Using tools/appliances 4  13. Opening doors 4  14. Cleaning  4  15.  Tying or lacing shoes 4  16. Sleeping  1  17. Laundering clothes (I.e. washing, ironing, folding) 4  18. Opening a jar 2  19. Throwing a ball 0  20. Carrying a small suitcase with your affected limb.  2  Score total:  51/80=64%      UPPER EXTREMITY ROM:       Assessed in sitting, er/IR adducted  Active ROM Right eval  Shoulder flexion 121  Shoulder abduction 87  Shoulder internal rotation 90  Shoulder external rotation 50  (Blank rows = not tested)    UPPER EXTREMITY MMT:     Assessed in sitting, er/IR adducted  MMT Right eval  Shoulder flexion 4+/5  Shoulder abduction 3-/5  Shoulder internal rotation 5/5  Shoulder external rotation 5/5  Elbow flexion 4+/5  Elbow extension 5/5  (Blank rows = not tested)  HAND FUNCTION: Grip strength: Right: 45 lbs; Left: 47 lbs  SENSATION: WFL  EDEMA: None  COGNITION: Overall cognitive status: Within functional limits for tasks assessed  OBSERVATIONS: mod fascial restrictions along upper arm, anterior shoulder, and trapezius   TODAY'S TREATMENT:                                                                                                                              DATE:  03/26/24 -Manual techniques: myofascial release to right upper arm and anterior shoulder, trapezius, to reduce pain and fascial restrictions and improve joint ROM -P/ROM: flexion, abduction, horizontal abduction, er, 5 reps -AA/ROM: supine-protraction, flexion, abduction, er, horizontal abduction, 10 reps -Shoulder stretches: flexion, abduction, IR behind back with horizontal towel, corner stretch, er, 2x30 holds -Scapular theraband: red-row, extension, 10 reps -UBE: level 1, 2' forward, 2' reverse, pace: 6.5  Eval -AA/ROM: sitting-protraction, flexion, horizontal abduction, er, abduction, 10 reps     PATIENT EDUCATION: Education details: shoulder stretches Person educated: Patient Education method: Explanation, Demonstration, and  Handouts Education comprehension: verbalized understanding and returned demonstration  HOME EXERCISE PROGRAM: Eval: AA/ROM 1/29: shoulder stretches   GOALS: Goals reviewed with patient? Yes   SHORT TERM GOALS: Target date: 04/10/24  Pt will be provided with and educated on HEP to improve mobility in RUE required for use during ADL completion.   Goal status: IN PROGRESS  2.  Pt will increase RUE P/ROM by 30+ degrees to improve ability to use RUE during dressing  tasks with minimal compensatory techniques.   Goal status: IN PROGRESS  3.  Pt will increase RUE strength to 5/5 to improve ability to use RUE when lifting or carrying items during meal preparation/housework/yardwork tasks.  Goal status: IN PROGRESS  4.  Pt will decrease pain in RUE to 3/10 or less to improve ability to sleep for 2+ consecutive hours without waking due to pain.   Goal status: IN PROGRESS  5.  Pt will decrease RUE fascial restrictions to min amounts or less to improve mobility required for functional reaching tasks.   Goal status: IN PROGRESS     ASSESSMENT:  CLINICAL IMPRESSION: Pt reports she was very sore and thought she may have overdone her exercises. Initiated manual techniques today, pt with trigger point in upper trapezius and significant restrictions in anterior shoulder region, responded well to STM. Pt completing AA/ROM in supine with exception of abduction, added shoulder stretches with pt achieving great ROM at 85%+. Updated HEP to include shoulder stretches, discussed frequency of duration of both sets of HEPs. Verbal and visual cuing for form and technique.    PERFORMANCE DEFICITS: in functional skills including in functional skills including ADLs, IADLs, coordination, tone, ROM, strength, pain, fascial restrictions, muscle spasms, and UE functional use.       PLAN:  OT FREQUENCY: 2x/week  OT DURATION: 4 weeks  PLANNED INTERVENTIONS: 97168 OT Re-evaluation, 97535 self care/ADL  training, 02889 therapeutic exercise, 97530 therapeutic activity, 97112 neuromuscular re-education, 97140 manual therapy, 97014 electrical stimulation unattended, patient/family education, and DME and/or AE instructions  CONSULTED AND AGREED WITH PLAN OF CARE: Patient  PLAN FOR NEXT SESSION: Follow up on HEP, manual techniques, AA/ROM progressing to A/ROM, functional reaching, shoulder stretches   Sonny Cory, OTR/L  [phone] 03/26/2024, 10:53 AM    "

## 2024-03-26 NOTE — Patient Instructions (Signed)

## 2024-03-31 ENCOUNTER — Ambulatory Visit (HOSPITAL_COMMUNITY): Admitting: Occupational Therapy

## 2024-03-31 ENCOUNTER — Encounter (HOSPITAL_COMMUNITY): Payer: Self-pay | Admitting: Occupational Therapy

## 2024-03-31 DIAGNOSIS — G8929 Other chronic pain: Secondary | ICD-10-CM

## 2024-03-31 DIAGNOSIS — R29898 Other symptoms and signs involving the musculoskeletal system: Secondary | ICD-10-CM

## 2024-03-31 NOTE — Patient Instructions (Addendum)
Repeat all exercises 10-15 times, 1-2 times per day.  1) Shoulder Protraction    Begin with elbows by your side, slowly "punch" straight out in front of you.      2) Shoulder Flexion  Supine:     Standing:         Begin with arms at your side with thumbs pointed up, slowly raise both arms up and forward towards overhead.               3) Horizontal abduction/adduction  Supine:   Standing:           Begin with arms straight out in front of you, bring out to the side in at "T" shape. Keep arms straight entire time.                 4) Internal & External Rotation   Supine:     Standing:     Stand with elbows at the side and elbows bent 90 degrees. Move your forearms away from your body, then bring back inward toward the body.     5) Shoulder Abduction  Supine:     Standing:       Lying on your back begin with your arms flat on the table next to your side. Slowly move your arms out to the side so that they go overhead, in a jumping jack or snow angel movement.   1) (Home) Extension: Isometric / Bilateral Arm Retraction - Sitting   Facing anchor, hold hands and elbow at shoulder height, with elbow bent.  Pull arms back to squeeze shoulder blades together. Repeat 10-15 times. 1-3 times/day.   2) (Clinic) Extension / Flexion (Assist)   Face anchor, pull arms back, keeping elbow straight, and squeze shoulder blades together. Repeat 10-15 times. 1-3 times/day.   Copyright  [employer]. All rights reserved.   3) (Home) Retraction: Row - Bilateral (Anchor)   Facing anchor, arms reaching forward, pull hands toward stomach, keeping elbows bent and at your sides and pinching shoulder blades together. Repeat 10-15 times. 1-3 times/day.   Copyright  [employer]. All rights reserved.     

## 2024-04-02 ENCOUNTER — Ambulatory Visit (HOSPITAL_COMMUNITY): Admitting: Occupational Therapy

## 2024-04-02 ENCOUNTER — Encounter (HOSPITAL_COMMUNITY): Payer: Self-pay | Admitting: Occupational Therapy

## 2024-04-02 DIAGNOSIS — R29898 Other symptoms and signs involving the musculoskeletal system: Secondary | ICD-10-CM

## 2024-04-02 DIAGNOSIS — G8929 Other chronic pain: Secondary | ICD-10-CM

## 2024-04-02 NOTE — Therapy (Signed)
 " OUTPATIENT OCCUPATIONAL THERAPY ORTHO TREATMENT  Patient Name: Ellen Ayers MRN: [id number] DOB:Aug 31, 1955, 69 y.o., female Today's Date: 04/02/2024   END OF SESSION:  OT End of Session - 04/02/24 1036     Visit Number 4    Number of Visits 8    Date for Recertification  04/09/24    Authorization Type [employer]    Authorization Time Period 8 visits approved 1/13-2/12    Authorization - Visit Number 4    Authorization - Number of Visits 8    Progress Note Due on Visit 10    OT Start Time 0951    OT Stop Time 1032    OT Time Calculation (min) 41 min    Activity Tolerance Patient tolerated treatment well    Behavior During Therapy [facility] for tasks assessed/performed           Past Medical History:  Diagnosis Date   Acute sinusitis 01/11/2021   Allergic rhinitis    Hyperlipidemia    Hypothyroidism    IFG (impaired fasting glucose)    Reactive airway disease    Venous stasis    Past Surgical History:  Procedure Laterality Date   CHOLECYSTECTOMY     Patient Active Problem List   Diagnosis Date Noted   Rotator cuff strain, right, initial encounter 03/05/2024   Thumb tendonitis 01/30/2024   Varicose veins of both lower extremities with pain 01/30/2024   Vasomotor rhinitis 11/23/2022   Other secondary kyphosis, thoracic region 11/23/2022   Osteoporosis 09/22/2021   Primary osteoarthritis of both hands 01/11/2021   Anxiety 07/09/2014   Vitamin D  deficiency 07/09/2014   Diabetes mellitus type 2, uncomplicated (HCC) 07/06/2014   Morbid obesity (HCC) 12/06/2013   Essential hypertension 10/06/2013   Hypothyroidism 06/09/2012   Hyperlipidemia 06/09/2012    PCP: Jacqulyn Ahle, DO  REFERRING PROVIDER: Jacqulyn Ahle, DO  ONSET DATE: August 2025  REFERRING DIAG: S46.011A (ICD-10-CM) - Rotator cuff strain, right, initial encounter   THERAPY DIAG:  Chronic right shoulder pain  Other symptoms and signs involving the musculoskeletal system  Rationale for Evaluation  and Treatment: Rehabilitation  SUBJECTIVE:   SUBJECTIVE STATEMENT: S: I felt good when I left but later in the day it's very sore  PERTINENT HISTORY: Pt is a 69 y/o female who injured her right shoulder in August 2025 when she was moving a refrigerator  to clean under it, moving it several times. It has gotten progressively worse. Pt has had an x-ray but no additional imaging.   PRECAUTIONS: None  WEIGHT BEARING RESTRICTIONS: No  PAIN:  Are you having pain? Yes: NPRS scale: 4/10 Pain location: right shoulder Pain description: sore Aggravating factors: picking up wood Relieving factors: rest  FALLS: Has patient fallen in last 6 months? No  PLOF: Independent  PATIENT GOALS: To have less pain and more mobility.   NEXT MD VISIT: None scheduled  OBJECTIVE:   HAND DOMINANCE: Right  ADLs: Overall ADLs: Pt reports difficulty with reaching behind her back, up to the right, or out to the right is difficulty. Putting her pocketbook on the passenger seat is difficult. Sometimes bothers her at night, takes pain medication to sleep. Pt uses compensatory strategies for dressing and bathing. Pt reports she can manage most tasks taking pain pills, but without pain medication pt has extreme difficulty with task completion.   FUNCTIONAL OUTCOME MEASURES: UEFS  Extreme difficulty/unable (0), Quite a bit of difficulty (1), Moderate difficulty (2), Little difficulty (3), No difficulty (4) Survey date:  03/10/24  Any of your usual work, household or school activities 3  2. Your usual hobbies, recreational/sport activities 1   3. Lifting a bag of groceries to waist level 2   4. Lifting a bag of groceries above your head 0  5. Grooming your hair 2  6. Pushing up on your hands (I.e. from bathtub or chair) 3  7. Preparing food (I.e. peeling/cutting) 3  8. Driving  4  9. Vacuuming, sweeping, or raking 0  10. Dressing  4  11. Doing up buttons 4  12. Using tools/appliances 4  13. Opening doors  4  14. Cleaning  4  15. Tying or lacing shoes 4  16. Sleeping  1  17. Laundering clothes (I.e. washing, ironing, folding) 4  18. Opening a jar 2  19. Throwing a ball 0  20. Carrying a small suitcase with your affected limb.  2  Score total:  51/80=64%      UPPER EXTREMITY ROM:       Assessed in sitting, er/IR adducted  Active ROM Right eval  Shoulder flexion 121  Shoulder abduction 87  Shoulder internal rotation 90  Shoulder external rotation 50  (Blank rows = not tested)    UPPER EXTREMITY MMT:     Assessed in sitting, er/IR adducted  MMT Right eval  Shoulder flexion 4+/5  Shoulder abduction 3-/5  Shoulder internal rotation 5/5  Shoulder external rotation 5/5  Elbow flexion 4+/5  Elbow extension 5/5  (Blank rows = not tested)  HAND FUNCTION: Grip strength: Right: 45 lbs; Left: 47 lbs  SENSATION: WFL  EDEMA: None  COGNITION: Overall cognitive status: Within functional limits for tasks assessed  OBSERVATIONS: mod fascial restrictions along upper arm, anterior shoulder, and trapezius   TODAY'S TREATMENT:                                                                                                                              DATE:  04/02/24 -Manual techniques: myofascial release to right upper arm and anterior shoulder, trapezius, to reduce pain and fascial restrictions and improve joint ROM -P/ROM: flexion, abduction, horizontal abduction, er, 5 reps -A/ROM: supine-protraction, flexion, abduction, er, horizontal abduction, 12 reps -Proximal shoulder strengthening: supine-paddles, criss cross, circles each direction, 10 reps -A/ROM: sidelying-protraction, flexion, abduction, er, horizontal abduction, 12 reps -Scapular theraband: red-row, extension, retraction, 10 reps -Overhead lacing, 1# wrist weight, seated lacing from top down then reversing -Functional reaching: pt placing 10 cones on middle shelf of overhead cabinet in flexion, removing in abduction,  1# wrist weight -Table slides: flexion, abduction, 10 reps -UBE: level 2, 2' reverse, pace: 6.5  03/31/24 -Manual techniques: myofascial release to right upper arm and anterior shoulder, trapezius, to reduce pain and fascial restrictions and improve joint ROM -P/ROM: supine - flexion, abduction, horizontal abduction, er, x6 -A/ROM: supine-protraction, flexion, abduction, er, horizontal abduction, x12 -Proximal Shoulder Exercises: paddles, criss cross, circles both directions, x10 each -functional reaching: counter to 1st shelf  to 2nd shelf, up in flexion, down in abduction -Scapular theraband: red-row, extension, retraction x12 -Shoulder Strengthening: red - horizontal abduction, er, IR, x12  03/26/24 -Manual techniques: myofascial release to right upper arm and anterior shoulder, trapezius, to reduce pain and fascial restrictions and improve joint ROM -P/ROM: flexion, abduction, horizontal abduction, er, 5 reps -AA/ROM: supine-protraction, flexion, abduction, er, horizontal abduction, 10 reps -Shoulder stretches: flexion, abduction, IR behind back with horizontal towel, corner stretch, er, 2x30 holds -Scapular theraband: red-row, extension, 10 reps -UBE: level 1, 2' forward, 2' reverse, pace: 6.5    PATIENT EDUCATION: Education details: table slides Person educated: Patient Education method: Explanation, Demonstration, and Handouts Education comprehension: verbalized understanding and returned demonstration  HOME EXERCISE PROGRAM: Eval: AA/ROM 1/29: shoulder stretches 2/3: A/ROM and scapular tband 2/5: table slides   GOALS: Goals reviewed with patient? Yes   SHORT TERM GOALS: Target date: 04/10/24  Pt will be provided with and educated on HEP to improve mobility in RUE required for use during ADL completion.   Goal status: IN PROGRESS  2.  Pt will increase RUE P/ROM by 30+ degrees to improve ability to use RUE during dressing tasks with minimal compensatory techniques.    Goal status: IN PROGRESS  3.  Pt will increase RUE strength to 5/5 to improve ability to use RUE when lifting or carrying items during meal preparation/housework/yardwork tasks.  Goal status: IN PROGRESS  4.  Pt will decrease pain in RUE to 3/10 or less to improve ability to sleep for 2+ consecutive hours without waking due to pain.   Goal status: IN PROGRESS  5.  Pt will decrease RUE fascial restrictions to min amounts or less to improve mobility required for functional reaching tasks.   Goal status: IN PROGRESS     ASSESSMENT:  CLINICAL IMPRESSION: Pt reports she is extremely sore the evening of and morning after therapy appts and cannot do her HEP. Educated on table slides for continued stretching versus the wall stretches, pt reports this feels much better. Continued with A/ROM and added sidelying, pt with full ROM in all positions. Added functional reaching tasks using weight for resistance. Verbal cuing for form and technique, targeting improved functional use of the RUE required for daily task completion.    PERFORMANCE DEFICITS: in functional skills including in functional skills including ADLs, IADLs, coordination, tone, ROM, strength, pain, fascial restrictions, muscle spasms, and UE functional use.       PLAN:  OT FREQUENCY: 2x/week  OT DURATION: 4 weeks  PLANNED INTERVENTIONS: 97168 OT Re-evaluation, 97535 self care/ADL training, 02889 therapeutic exercise, 97530 therapeutic activity, 97112 neuromuscular re-education, 97140 manual therapy, 97014 electrical stimulation unattended, patient/family education, and DME and/or AE instructions  CONSULTED AND AGREED WITH PLAN OF CARE: Patient  PLAN FOR NEXT SESSION: Follow up on HEP, manual techniques, A/ROM, functional reaching, shoulder stretches, strengthening and stability work   [employer], OTR/L  [phone] 04/02/2024, 10:37 AM    "

## 2024-04-06 ENCOUNTER — Ambulatory Visit (HOSPITAL_COMMUNITY): Admitting: Occupational Therapy

## 2024-04-09 ENCOUNTER — Ambulatory Visit (HOSPITAL_COMMUNITY): Admitting: Occupational Therapy

## 2024-04-13 ENCOUNTER — Ambulatory Visit (HOSPITAL_COMMUNITY): Admitting: Occupational Therapy

## 2024-04-16 ENCOUNTER — Ambulatory Visit (HOSPITAL_COMMUNITY): Admitting: Occupational Therapy

## 2024-04-20 ENCOUNTER — Ambulatory Visit (HOSPITAL_COMMUNITY): Admitting: Occupational Therapy
